# Patient Record
Sex: Female | Born: 1985 | Race: Black or African American | Hispanic: No | Marital: Single | Smoking: Never smoker
Health system: Southern US, Community
[De-identification: ages and names within clinical notes are randomized; demographics above are authoritative.]

## PROBLEM LIST (undated history)

## (undated) DIAGNOSIS — G4733 Obstructive sleep apnea (adult) (pediatric): Secondary | ICD-10-CM

## (undated) DIAGNOSIS — E119 Type 2 diabetes mellitus without complications: Secondary | ICD-10-CM

## (undated) DIAGNOSIS — E039 Hypothyroidism, unspecified: Secondary | ICD-10-CM

## (undated) DIAGNOSIS — I4891 Unspecified atrial fibrillation: Secondary | ICD-10-CM

## (undated) DIAGNOSIS — E876 Hypokalemia: Secondary | ICD-10-CM

## (undated) DIAGNOSIS — R000 Tachycardia, unspecified: Secondary | ICD-10-CM

## (undated) DIAGNOSIS — R0602 Shortness of breath: Secondary | ICD-10-CM

## (undated) DIAGNOSIS — R0609 Other forms of dyspnea: Secondary | ICD-10-CM

## (undated) DIAGNOSIS — D219 Benign neoplasm of connective and other soft tissue, unspecified: Secondary | ICD-10-CM

## (undated) DIAGNOSIS — U071 COVID-19: Secondary | ICD-10-CM

## (undated) DIAGNOSIS — I10 Essential (primary) hypertension: Secondary | ICD-10-CM

## (undated) HISTORY — DX: Unspecified atrial fibrillation: I48.91

## (undated) HISTORY — DX: Hypokalemia: E87.6

## (undated) HISTORY — DX: Type 2 diabetes mellitus without complications: E11.9

## (undated) HISTORY — DX: Tachycardia, unspecified: R00.0

## (undated) HISTORY — DX: Benign neoplasm of connective and other soft tissue, unspecified: D21.9

## (undated) HISTORY — DX: COVID-19: U07.1

## (undated) HISTORY — DX: Hypothyroidism, unspecified: E03.9

## (undated) HISTORY — DX: Shortness of breath: R06.02

## (undated) HISTORY — DX: Obstructive sleep apnea (adult) (pediatric): G47.33

## (undated) HISTORY — DX: Other forms of dyspnea: R06.09

---

## 2019-03-03 ENCOUNTER — Other Ambulatory Visit: Payer: Self-pay

## 2019-03-03 ENCOUNTER — Emergency Department (HOSPITAL_COMMUNITY)
Admission: EM | Admit: 2019-03-03 | Discharge: 2019-03-03 | Disposition: A | Discharge status: Home / Self Care | Payer: Medicaid - Out of State | Attending: Emergency Medicine | Admitting: Emergency Medicine

## 2019-03-03 ENCOUNTER — Encounter (HOSPITAL_COMMUNITY): Payer: Self-pay | Admitting: Emergency Medicine

## 2019-03-03 ENCOUNTER — Emergency Department (HOSPITAL_COMMUNITY): Payer: Medicaid - Out of State

## 2019-03-03 DIAGNOSIS — R002 Palpitations: Secondary | ICD-10-CM | POA: Diagnosis present

## 2019-03-03 DIAGNOSIS — I10 Essential (primary) hypertension: Secondary | ICD-10-CM | POA: Diagnosis not present

## 2019-03-03 DIAGNOSIS — I4891 Unspecified atrial fibrillation: Secondary | ICD-10-CM | POA: Diagnosis not present

## 2019-03-03 DIAGNOSIS — E876 Hypokalemia: Secondary | ICD-10-CM | POA: Diagnosis not present

## 2019-03-03 DIAGNOSIS — R0602 Shortness of breath: Secondary | ICD-10-CM | POA: Insufficient documentation

## 2019-03-03 HISTORY — DX: Essential (primary) hypertension: I10

## 2019-03-03 LAB — TROPONIN I: Troponin I: <0.03 ng/mL (ref <0.03)

## 2019-03-03 LAB — BASIC METABOLIC PANEL
Anion gap: 12 (ref 5–15)
BUN: <5 mg/dL — ABNORMAL LOW (ref 6–20)
CO2: 24 mmol/L (ref 22–32)
Calcium: 9.6 mg/dL (ref 8.9–10.3)
Chloride: 106 mmol/L (ref 98–111)
Creatinine, Ser: 0.63 mg/dL (ref 0.44–1.00)
GFR calc Af Amer: >60 mL/min (ref >60)
GFR calc non Af Amer: >60 mL/min (ref >60)
Glucose, Bld: 132 mg/dL — ABNORMAL HIGH (ref 70–99)
Potassium: 2.8 mmol/L — ABNORMAL LOW (ref 3.5–5.1)
Sodium: 142 mmol/L (ref 135–145)

## 2019-03-03 LAB — I-STAT BETA HCG BLOOD, ED (MC, WL, AP ONLY): I-stat hCG, quantitative: <5 m[IU]/mL (ref <5)

## 2019-03-03 LAB — CBC
HCT: 45.1 % (ref 36.0–46.0)
Hemoglobin: 15.9 g/dL — ABNORMAL HIGH (ref 12.0–15.0)
MCH: 32.2 pg (ref 26.0–34.0)
MCHC: 35.3 g/dL (ref 30.0–36.0)
MCV: 91.3 fL (ref 80.0–100.0)
Platelets: 259 10*3/uL (ref 150–400)
RBC: 4.94 MIL/uL (ref 3.87–5.11)
RDW: 13.2 % (ref 11.5–15.5)
WBC: 10.4 10*3/uL (ref 4.0–10.5)
nRBC: 0 % (ref 0.0–0.2)

## 2019-03-03 LAB — BRAIN NATRIURETIC PEPTIDE: B Natriuretic Peptide: 64.8 pg/mL (ref 0.0–100.0)

## 2019-03-03 LAB — D-DIMER, QUANTITATIVE: D-Dimer, Quant: 0.4 ug/mL-FEU (ref 0.00–0.50)

## 2019-03-03 MED ORDER — SODIUM CHLORIDE 0.9 % IV BOLUS
1000.0000 mL | Freq: Once | INTRAVENOUS | Status: AC
  Administered 2019-03-03: 12:00:00 1000 mL via INTRAVENOUS

## 2019-03-03 MED ORDER — POTASSIUM CHLORIDE CRYS ER 20 MEQ PO TBCR: 20.0000 meq | EXTENDED_RELEASE_TABLET | Freq: Two times a day (BID) | ORAL | 0 refills | Status: DC

## 2019-03-03 MED ORDER — DILTIAZEM HCL 25 MG/5ML IV SOLN
15.0000 mg | Freq: Once | INTRAVENOUS | Status: AC
  Administered 2019-03-03: 15 mg via INTRAVENOUS
  Filled 2019-03-03: qty 5

## 2019-03-03 MED ORDER — APIXABAN 5 MG PO TABS: 5.0000 mg | ORAL_TABLET | Freq: Two times a day (BID) | ORAL | 0 refills | Status: DC

## 2019-03-03 MED ORDER — APIXABAN 5 MG PO TABS
5.0000 mg | ORAL_TABLET | Freq: Two times a day (BID) | ORAL | Status: DC
  Administered 2019-03-03: 5 mg via ORAL
  Filled 2019-03-03 (×2): qty 1

## 2019-03-03 MED ORDER — POTASSIUM CHLORIDE CRYS ER 20 MEQ PO TBCR
40.0000 meq | EXTENDED_RELEASE_TABLET | Freq: Once | ORAL | Status: AC
  Administered 2019-03-03: 40 meq via ORAL
  Filled 2019-03-03: qty 2

## 2019-03-03 MED ORDER — ETOMIDATE 2 MG/ML IV SOLN
INTRAVENOUS | Status: AC | PRN
  Administered 2019-03-03: 10 mg via INTRAVENOUS

## 2019-03-03 MED ORDER — ETOMIDATE 2 MG/ML IV SOLN
10.0000 mg | Freq: Once | INTRAVENOUS | Status: DC
  Filled 2019-03-03: qty 10

## 2019-03-03 NOTE — ED Notes (Signed)
Patient verbalizes understanding of discharge instructions. Opportunity for questioning and answers were provided. Armband removed by staff, pt discharged from ED ambulatory to home.  

## 2019-03-03 NOTE — ED Notes (Signed)
Pt back in [facility]. EKG completed

## 2019-03-03 NOTE — ED Triage Notes (Signed)
Pt here for heart palpitations and high blood pressure.

## 2019-03-03 NOTE — ED Provider Notes (Signed)
[facility] Emergency Department Provider Note MRN:  [id number]  Arrival date & time: 03/03/19     Chief Complaint   Palpitations History of Present Illness   Carly Ramos is a 33 y.o. year-old female with a history of hypertension presenting to the ED with chief complaint of palpitations.  Patient began experiencing sudden onset palpitations and mild left-sided chest discomfort this morning at about 5 AM.  Felt like her heart was racing.  Patient's boyfriend thought maybe it was a panic attack but she did not feel anxious or stressed.  Mild shortness of breath.  Denies headache or vision change, no fever, no cough, no abdominal pain.  No drug or alcohol use.  Patient measured her blood pressure and found it to be elevated, took 1 of her amlodipine 10 mg tablets, which she has not taken since she was having issues with high blood pressure during pregnancy.  Symptoms constant, moderate, no exacerbating relieving factors.  Review of Systems  A complete 10 system review of systems was obtained and all systems are negative except as noted in the HPI and PMH.   Patient's Health History    Past Medical History:  Diagnosis Date  . Hypertension     History reviewed. No pertinent surgical history.  No family history on file.  Social History   Socioeconomic History  . Marital status: Single    Spouse name: Not on file  . Number of children: Not on file  . Years of education: Not on file  . Highest education level: Not on file  Occupational History  . Not on file  Social Needs  . Financial resource strain: Not on file  . Food insecurity:    Worry: Not on file    Inability: Not on file  . Transportation needs:    Medical: Not on file    Non-medical: Not on file  Tobacco Use  . Smoking status: Not on file  Substance and Sexual Activity  . Alcohol use: Not on file  . Drug use: Not on file  . Sexual activity: Not on file  Lifestyle  . Physical activity:    Days  per week: Not on file    Minutes per session: Not on file  . Stress: Not on file  Relationships  . Social connections:    Talks on phone: Not on file    Gets together: Not on file    Attends religious service: Not on file    Active member of club or organization: Not on file    Attends meetings of clubs or organizations: Not on file    Relationship status: Not on file  . Intimate partner violence:    Fear of current or ex partner: Not on file    Emotionally abused: Not on file    Physically abused: Not on file    Forced sexual activity: Not on file  Other Topics Concern  . Not on file  Social History Narrative  . Not on file     Physical Exam  Vital Signs and Nursing Notes reviewed Vitals:   03/03/19 1545 03/03/19 1600  BP: (!) 137/97 (!) 131/91  Pulse:    Resp: 19 16  Temp:    SpO2:      CONSTITUTIONAL: Well-appearing, NAD NEURO:  Alert and oriented x 3, no focal deficits EYES:  eyes equal and reactive ENT/NECK:  no LAD, no JVD CARDIO: Tachycardic and irregular rate, well-perfused, normal S1 and S2 PULM:  CTAB no wheezing  or rhonchi GI/GU:  normal bowel sounds, non-distended, non-tender MSK/SPINE:  No gross deformities, no edema SKIN:  no rash, atraumatic PSYCH:  Appropriate speech and behavior  Diagnostic and Interventional Summary    EKG Interpretation  Date/Time:  Sunday March 03 2019 11:15:39 EDT Ventricular Rate:  123 PR Interval:    QRS Duration: 96 QT Interval:  321 QTC Calculation: 458 R Axis:   61 Text Interpretation:  Atrial fibrillation Borderline repolarization abnormality Confirmed by Gerlene Fee [phone]) on 03/03/2019 11:33:46 AM      Labs Reviewed  CBC - Abnormal; Notable for the following components:      Result Value   Hemoglobin 15.9 (*)    All other components within normal limits  BASIC METABOLIC PANEL - Abnormal; Notable for the following components:   Potassium 2.8 (*)    Glucose, Bld 132 (*)    BUN <5 (*)    All other  components within normal limits  TROPONIN I  BRAIN NATRIURETIC PEPTIDE  D-DIMER, QUANTITATIVE (NOT AT [facility])  I-STAT BETA HCG BLOOD, ED ([facility], [facility], [facility] ONLY)    DG Chest Port 1 View  Final Result      Medications  etomidate (AMIDATE) injection 10 mg (10 mg Intravenous See Procedure Record 03/03/19 1441)  apixaban (ELIQUIS) tablet 5 mg (5 mg Oral Given 03/03/19 1509)  sodium chloride 0.9 % bolus 1,000 mL (0 mLs Intravenous Stopped 03/03/19 1441)  diltiazem (CARDIZEM) injection 15 mg (15 mg Intravenous Given 03/03/19 1158)  potassium chloride SA (K-DUR) CR tablet 40 mEq (40 mEq Oral Given 03/03/19 1509)  etomidate (AMIDATE) injection (10 mg Intravenous Given 03/03/19 1417)     .Cardioversion Date/Time: 03/03/2019 4:27 PM Performed by: Maudie Flakes, MD Authorized by: Maudie Flakes, MD   Consent:    Consent obtained:  Verbal and written   Consent given by:  Patient   Risks discussed:  Induced arrhythmia and pain   Alternatives discussed:  No treatment and rate-control medication Pre-procedure details:    Cardioversion basis:  Elective   Rhythm:  Atrial fibrillation   Electrode placement:  Anterior-posterior Patient sedated: Yes. Refer to sedation procedure documentation for details of sedation.  Attempt one:    Cardioversion mode:  Synchronous   Waveform:  Biphasic   Shock (Joules):  100   Shock outcome:  Conversion to normal sinus rhythm Post-procedure details:    Patient status:  Awake   Patient tolerance of procedure:  Tolerated well, no immediate complications .Sedation Date/Time: 03/03/2019 4:29 PM Performed by: Maudie Flakes, MD Authorized by: Maudie Flakes, MD   Consent:    Consent obtained:  Verbal and written   Consent given by:  Patient   Risks discussed:  Allergic reaction and respiratory compromise necessitating ventilatory assistance and intubation Universal protocol:    Immediately prior to procedure a time out was called: yes     Patient identity  confirmation method:  Provided demographic data, verbally with patient, arm band and hospital-assigned identification number Indications:    Procedure performed:  Cardioversion Pre-sedation assessment:    Time since last food or drink:  4 hours   ASA classification: class 1 - normal, healthy patient     Neck mobility: normal     Mallampati score:  I - soft palate, uvula, fauces, pillars visible   Pre-sedation assessments completed and reviewed: airway patency, cardiovascular function, mental status and respiratory function   Immediate pre-procedure details:    Reviewed: vital signs and relevant labs/tests  Verified: bag valve mask available, emergency equipment available, intubation equipment available, IV patency confirmed, oxygen available and suction available   Procedure details (see MAR for exact dosages):    Preoxygenation:  Nasal cannula   Sedation:  Etomidate   Intra-procedure events: none     Total Provider sedation time (minutes):  20 Post-procedure details:    Post-sedation assessments completed and reviewed: airway patency, cardiovascular function, mental status and respiratory function     Patient is stable for discharge or admission: yes     Patient tolerance:  Tolerated well, no immediate complications Comments:     Uncomplicated procedural sedation with 10 mg etomidate.   Critical Care Critical Care Documentation Critical care time provided by me (excluding procedures): 34 minutes  Condition necessitating critical care: Atrial fibrillation with rapid ventricular response  Components of critical care management: reviewing of prior records, laboratory and imaging interpretation, frequent re-examination and reassessment of vital signs, administration of IV diltiazem, discussion with consulting services    ED Course and Medical Decision Making  I have reviewed the triage vital signs and the nursing notes.  Pertinent labs & imaging results that were available  during my care of the patient were reviewed by me and considered in my medical decision making (see below for details).  Young healthy female presenting with A. fib and RVR, mild chest pain/shortness of breath, no history of A. fib, no personal history of DVT, no birth control, but positive family history of DVT and given patient's age this would be atypical for a young person to develop a primary cardiac A. fib.  Will evaluate for underlying cause, namely pulmonary embolism, labs, troponin, d-dimer pending.  Patient's onset time is estimated to be 5 AM, would be a potential candidate for cardioversion.  Will provide diltiazem for rate control while initiating work-up.  D-dimer negative, low potassium here, repleted.  Discussed case with cardiology given patient's age, cardiology agrees with appropriateness for cardioversion.  Performed as described above.  Patient is back to normal sinus rhythm, awake, feels much better, appropriate for follow-up in A. fib clinic.  Prescription for Eliquis, educated on the risks of anticoagulation, also prescription for potassium for continued repletion.  After the discussed management above, the patient was determined to be safe for discharge.  The patient was in agreement with this plan and all questions regarding their care were answered.  ED return precautions were discussed and the patient will return to the ED with any significant worsening of condition.  Barth Kirks. Sedonia Small, MD [facility] [email or web address]  Final Clinical Impressions(s) / ED Diagnoses     ICD-10-CM   1. Atrial fibrillation with RVR (HCC) I48.91   2. SOB (shortness of breath) R06.02 DG [facility] 1 View    DG Chest [facility] 1 View  3. Hypokalemia E87.6     ED Discharge Orders         Ordered    Amb referral to AFIB Clinic     03/03/19 1324    potassium chloride SA (K-DUR) 20 MEQ tablet  2 times daily     03/03/19 1625    apixaban (ELIQUIS) 5  MG TABS tablet  2 times daily     03/03/19 1625             Maudie Flakes, MD 03/03/19 [id number]

## 2019-03-03 NOTE — ED Notes (Signed)
Pt shocked at 100 jolts by MD [facility]

## 2019-03-03 NOTE — Discharge Instructions (Addendum)
You were evaluated in the Emergency Department and after careful evaluation, we did not find any emergent condition requiring admission or further testing in the hospital.  Your symptoms today seem to be due to atrial fibrillation.  We performed a cardioversion procedure today that returned your heart to the normal rhythm.  It is important that you call the A. fib clinic for follow-up within the next few days.  Please take the Eliquis blood thinner medication twice a day as directed.  Your labs showed low potassium, which might be contributing to your A. fib.  Please take the potassium supplement as directed and try to increase the amount of potassium in your diet.  Please return to the Emergency Department if you experience any worsening of your condition.  We encourage you to follow up with a primary care provider.  Thank you for allowing [place] to be a part of your care.   Information on my medicine - ELIQUIS (apixaban)  This medication education was reviewed with me or my healthcare representative as part of my discharge preparation.   WHY WAS ELIQUIS PRESCRIBED FOR YOU? Eliquis was prescribed for you to reduce the risk of a blood clot forming that can cause a stroke if you have a medical condition called atrial fibrillation (a type of irregular heartbeat).  WHAT DO YOU NEED TO KNOW ABOUT ELIQUIS ? Take your Eliquis TWICE DAILY - one tablet in the morning and one tablet in the evening with or without food. If you have difficulty swallowing the tablet whole please discuss with your pharmacist how to take the medication safely.  Take Eliquis exactly as prescribed by your doctor and DO NOT stop taking Eliquis without talking to the doctor who prescribed the medication.  Stopping may increase your risk of developing a stroke.  Refill your prescription before you run out.  After discharge, you should have regular check-up appointments with your healthcare provider that is prescribing your  Eliquis.  In the future your dose may need to be changed if your kidney function or weight changes by a significant amount or as you get older.  WHAT DO YOU DO IF YOU MISS A DOSE? If you miss a dose, take it as soon as you remember on the same day and resume taking twice daily.  Do not take more than one dose of ELIQUIS at the same time to make up a missed dose.  IMPORTANT SAFETY INFORMATION A possible side effect of Eliquis is bleeding. You should call your healthcare provider right away if you experience any of the following: Bleeding from an injury or your nose that does not stop. Unusual colored urine (red or dark brown) or unusual colored stools (red or black). Unusual bruising for unknown reasons. A serious fall or if you hit your head (even if there is no bleeding).  Some medicines may interact with Eliquis and might increase your risk of bleeding or clotting while on Eliquis. To help avoid this, consult your healthcare provider or pharmacist prior to using any new prescription or non-prescription medications, including herbals, vitamins, non-steroidal anti-inflammatory drugs (NSAIDs) and supplements.  This website has more information on Eliquis (apixaban): http://www.eliquis.com/eliquis/home

## 2019-03-03 NOTE — ED Notes (Signed)
Consent for the cardioversion have been completed and witnessed by this RN. Copy placed in medical record drawer

## 2019-03-05 ENCOUNTER — Telehealth (HOSPITAL_COMMUNITY): Payer: Self-pay | Admitting: *Deleted

## 2019-03-05 NOTE — Telephone Encounter (Signed)
lft vcml for pt to [facility] re referral to afib clinic since recent ED visit.  Pt dccv to NSR

## 2019-12-19 ENCOUNTER — Emergency Department (HOSPITAL_COMMUNITY): Payer: Medicaid - Out of State

## 2019-12-19 ENCOUNTER — Emergency Department (HOSPITAL_COMMUNITY)
Admission: EM | Admit: 2019-12-19 | Discharge: 2019-12-19 | Disposition: A | Discharge status: Home / Self Care | Payer: Medicaid - Out of State | Attending: Emergency Medicine | Admitting: Emergency Medicine

## 2019-12-19 ENCOUNTER — Other Ambulatory Visit: Payer: Self-pay

## 2019-12-19 DIAGNOSIS — R002 Palpitations: Secondary | ICD-10-CM | POA: Diagnosis not present

## 2019-12-19 DIAGNOSIS — R067 Sneezing: Secondary | ICD-10-CM | POA: Insufficient documentation

## 2019-12-19 DIAGNOSIS — I10 Essential (primary) hypertension: Secondary | ICD-10-CM | POA: Diagnosis not present

## 2019-12-19 DIAGNOSIS — R05 Cough: Secondary | ICD-10-CM | POA: Insufficient documentation

## 2019-12-19 DIAGNOSIS — M62838 Other muscle spasm: Secondary | ICD-10-CM | POA: Insufficient documentation

## 2019-12-19 DIAGNOSIS — R0789 Other chest pain: Secondary | ICD-10-CM | POA: Insufficient documentation

## 2019-12-19 DIAGNOSIS — Z7982 Long term (current) use of aspirin: Secondary | ICD-10-CM | POA: Diagnosis not present

## 2019-12-19 DIAGNOSIS — Z79899 Other long term (current) drug therapy: Secondary | ICD-10-CM | POA: Insufficient documentation

## 2019-12-19 LAB — CBC
HCT: 40.2 % (ref 36.0–46.0)
Hemoglobin: 13.6 g/dL (ref 12.0–15.0)
MCH: 32.2 pg (ref 26.0–34.0)
MCHC: 33.8 g/dL (ref 30.0–36.0)
MCV: 95 fL (ref 80.0–100.0)
Platelets: 231 10*3/uL (ref 150–400)
RBC: 4.23 MIL/uL (ref 3.87–5.11)
RDW: 13.1 % (ref 11.5–15.5)
WBC: 7 10*3/uL (ref 4.0–10.5)
nRBC: 0 % (ref 0.0–0.2)

## 2019-12-19 LAB — BASIC METABOLIC PANEL
Anion gap: 9 (ref 5–15)
BUN: <5 mg/dL — ABNORMAL LOW (ref 6–20)
CO2: 28 mmol/L (ref 22–32)
Calcium: 9.2 mg/dL (ref 8.9–10.3)
Chloride: 103 mmol/L (ref 98–111)
Creatinine, Ser: 0.67 mg/dL (ref 0.44–1.00)
GFR calc Af Amer: >60 mL/min (ref >60)
GFR calc non Af Amer: >60 mL/min (ref >60)
Glucose, Bld: 117 mg/dL — ABNORMAL HIGH (ref 70–99)
Potassium: 3.6 mmol/L (ref 3.5–5.1)
Sodium: 140 mmol/L (ref 135–145)

## 2019-12-19 LAB — TROPONIN I (HIGH SENSITIVITY): Troponin I (High Sensitivity): 3 ng/L (ref <18)

## 2019-12-19 LAB — I-STAT BETA HCG BLOOD, ED (MC, WL, AP ONLY): I-stat hCG, quantitative: <5 m[IU]/mL (ref <5)

## 2019-12-19 MED ORDER — NAPROXEN 500 MG PO TABS: 500.0000 mg | ORAL_TABLET | Freq: Two times a day (BID) | ORAL | 0 refills | Status: DC | PRN

## 2019-12-19 NOTE — Discharge Instructions (Addendum)
Please follow-up with your cardiologist as well as your primary care provider regarding today's encounter.  I have prescribed you naproxen you may take as needed for your reproducible chest wall discomfort.  Your EKG, chest x-ray, and lab work is all reassuring.  Suspect musculoskeletal injury versus residual discomfort from episode of atrial fibrillation that occurred yesterday that subsequently converted after the administration of your prescribed medication.  Please return to the ED or seek immediate medical attention for any new or worsening symptoms.

## 2019-12-19 NOTE — ED Notes (Signed)
Patient says she has a history AFIB, was asked to stop Eliquis, she now takes daily Aspirin 81 mg . Yesterday, she reports left chest tightness, sharp pain, felt like she had to "breath heavier" She thought it was gas but "the pain did not move" When she went to use the bathroom around midnight, she reports feeling left calf pain for about 5 mins

## 2019-12-19 NOTE — ED Triage Notes (Signed)
Pt reports feeling like she had heart palpitations while at work yesterday. Does have hx of afib. Missed daily medications yesterday. Reports feeling palpitations today but not as severe as yesterday. Skin, warm, dry. VSS. Reports dry cough, sharp pains yesterday and this morning.

## 2019-12-19 NOTE — ED Provider Notes (Signed)
[facility] Provider Note   CSN: [id number] Arrival date & time: 12/19/19  1051     History Chief Complaint  Patient presents with   Palpitations    Carly Ramos is a 34 y.o. female with PMH significant for HTN and paroxysmal A. fib with RVR diagnosed 03/03/2019 with subsequent ambulatory referral to atrial fibrillation clinic who presents to the ED with complaints of palpitations and chest tightness.  Patient reports that for the past 2 to 3 weeks she has been having a dry, nonproductive cough with intermittent sneezing.  She was evaluated at an urgent care and has tested negative for COVID-19 on multiple occasions.  Yesterday she was at the grocery store bending over to pick up a bag when she developed chest tightness.  Throughout the day, her tightness was approximately 7 out of 10 and she had associated heart palpitations.  She reports that she had not taken her regular medications in the morning and after taking them her palpitations ultimately resolved spontaneously.  Today she endorses continued chest tightness, worse with coughing.  She had attempted to schedule appoint with her cardiologist who subsequently referred her to the ED for evaluation given her symptoms.  Her last menses was at the end of January.  She also reports that in the middle of the night she went up to go the bathroom and developed what felt like a muscle spasm to her left calf which resolved spontaneous after 5 minutes.  She denies any fevers or chills, dizziness, headache, syncopal events, nausea or vomiting, abdominal pain, or other symptoms.  No history of clots.  She is also unsure as to why she has paroxysmal atrial fibrillation given that the only person in her family who has cardiac disease is her grandfather.  She does not drink alcohol or smoke tobacco.  Denies all other illicit drug use.   I reviewed patient's medical record and she was cardioverted previously with good  effect and ultimately started on Eliquis.  She is followed by [facility] and [facility] near [place], [place]. echocardiogram obtained 04/19/2019 demonstrated LVEF 65 to 70% however with impaired relaxation filling pattern.  HPI     Past Medical History:  Diagnosis Date   Hypertension     There are no problems to display for this patient.   No past surgical history on file.   OB History   No obstetric history on file.     No family history on file.  Social History   Tobacco Use   Smoking status: Not on file  Substance Use Topics   Alcohol use: Not on file   Drug use: Not on file    Home Medications Prior to Admission medications   Medication Sig Start Date End Date Taking? Authorizing Provider  amLODipine (NORVASC) 10 MG tablet Take 10 mg by mouth daily as needed (blood pressure).  11/05/18   [provider]  apixaban (ELIQUIS) 5 MG TABS tablet Take 1 tablet (5 mg total) by mouth 2 (two) times daily for 30 days. 03/03/19 04/02/19  Maudie Flakes, MD  aspirin-acetaminophen-caffeine (EXCEDRIN MIGRAINE) [phone] MG tablet Take 2 tablets by mouth daily.    [provider]  diphenhydrAMINE (BENADRYL) 25 MG tablet Take 25 mg by mouth every 6 (six) hours as needed for itching or allergies (hives).    [provider]  HYDROCORTISONE EX Apply 1 application topically as needed (hives).    [provider]  Multiple Vitamins-Minerals (MULTIVITAMIN  ADULT PO) Take 1 tablet by mouth daily. gummies    [provider]  naproxen (NAPROSYN) 500 MG tablet Take 1 tablet (500 mg total) by mouth 2 (two) times daily between meals as needed for moderate pain. 12/19/19   Corena Herter, PA-C  potassium chloride SA (K-DUR) 20 MEQ tablet Take 1 tablet (20 mEq total) by mouth 2 (two) times daily for 7 days. 03/03/19 03/10/19  Maudie Flakes, MD    Allergies    Penicillins  Review of Systems   Review of Systems  Constitutional:  Negative for chills and fever.  Respiratory: Positive for chest tightness. Negative for shortness of breath.   Cardiovascular: Positive for palpitations. Negative for leg swelling.  Gastrointestinal: Negative for abdominal pain and nausea.  Musculoskeletal: Negative for gait problem.  Skin: Negative for color change.  Neurological: Negative for dizziness.    Physical Exam Updated Vital Signs BP (!) 133/92    Pulse (!) 58    Temp 98.5 F (36.9 C) (Oral)    Resp 16    SpO2 100%   Physical Exam Vitals and nursing note reviewed. Exam conducted with a chaperone present.  Constitutional:      Appearance: Normal appearance.  HENT:     Head: Normocephalic and atraumatic.  Eyes:     General: No scleral icterus.    Conjunctiva/sclera: Conjunctivae normal.  Cardiovascular:     Rate and Rhythm: Normal rate and regular rhythm.     Pulses: Normal pulses.     Heart sounds: Normal heart sounds.  Pulmonary:     Effort: Pulmonary effort is normal. No respiratory distress.     Breath sounds: Normal breath sounds. No wheezing or rales.  Chest:     Chest wall: Tenderness present.  Abdominal:     General: Abdomen is flat. There is no distension.     Palpations: Abdomen is soft.     Tenderness: There is no abdominal tenderness. There is no guarding.  Musculoskeletal:     Right lower leg: No edema.     Left lower leg: No edema.  Skin:    General: Skin is dry.     Capillary Refill: Capillary refill takes less than 2 seconds.  Neurological:     Mental Status: She is alert and oriented to person, place, and time.     GCS: GCS eye subscore is 4. GCS verbal subscore is 5. GCS motor subscore is 6.  Psychiatric:        Mood and Affect: Mood normal.        Behavior: Behavior normal.        Thought Content: Thought content normal.     ED Results / Procedures / Treatments   Labs (all labs ordered are listed, but only abnormal results are displayed) Labs Reviewed  BASIC METABOLIC PANEL -  Abnormal; Notable for the following components:      Result Value   Glucose, Bld 117 (*)    BUN <5 (*)    All other components within normal limits  CBC  I-STAT BETA HCG BLOOD, ED ([facility], [facility], [facility] ONLY)  TROPONIN I (HIGH SENSITIVITY)    EKG EKG Interpretation  Date/Time:  Thursday December 19 2019 10:54:21 EST Ventricular Rate:  69 PR Interval:  186 QRS Duration: 86 QT Interval:  390 QTC Calculation: 417 R Axis:   7 Text Interpretation: Normal sinus rhythm Nonspecific ST abnormality similar when compared to priors Confirmed by Quintella Reichert [phone]) on 12/19/2019 11:07:42 AM   Radiology  DG Chest Portable 1 View  Result Date: 12/19/2019 CLINICAL DATA:  Exam reason: cough, chest tightness Pt states chest pain only when she coughs EXAM: PORTABLE CHEST 1 VIEW COMPARISON:  Chest radiograph 03/03/2019 FINDINGS: The heart size and mediastinal contours are within normal limits. Tiny linear opacity in the left mid lung likely scarring or atelectasis. No focal consolidation. The visualized skeletal structures are unremarkable. IMPRESSION: No acute cardiopulmonary finding. Electronically Signed   By: Audie Pinto M.D.   On: 12/19/2019 12:18    Procedures Procedures (including critical care time)  Medications Ordered in ED Medications - No data to display  ED Course  I have reviewed the triage vital signs and the nursing notes.  Pertinent labs & imaging results that were available during my care of the patient were reviewed by me and considered in my medical decision making (see chart for details).    MDM Rules/Calculators/A&P                      Patient's history and physical exam is consistent with a costochondritis or similar musculoskeletal injury secondary to her coughing and sneezing.  It is reproducible on physical exam with palpation and is also reproducible with cough.  Patient is PERC negative and is not complaining of pleuritic chest discomfort or shortness of breath  symptoms.  Her vital signs are reassuring.  DG chest demonstrates no acute cardiopulmonary disease.  Will obtain a troponin given her nonspecific ST changes, however poor story for ACS.  We will not need to trend troponin given chronicity of her chest tightness.  Dr. Ralene Bathe also personally evaluated patient and believes that her chest tightness could perhaps be residual discomfort due to going into atrial fibrillation yesterday before converting after beta-blocker administration.  Will prescribe naproxen for the patient to take as needed for her chest wall discomfort.  Patient states that she is no longer taking her Eliquis.  Instructed not to take if she restarts her Eliquis or if she develops epigastric pain or melena.  Instructed her to reach out to her cardiologist to schedule follow-up as soon as possible for ongoing evaluation and management of her paroxysmal atrial fibrillation in light of yesterday's event.  Patient feels very reassured by today's work-up.  Very strict return precautions discussed with the patient.  All of the evaluation and work-up results were discussed with the patient and any family at bedside. They were provided opportunity to ask any additional questions and have none at this time. They have expressed understanding of verbal discharge instructions as well as return precautions and are agreeable to the plan.    Final Clinical Impression(s) / ED Diagnoses Final diagnoses:  Palpitations  Chest wall discomfort    Rx / DC Orders ED Discharge Orders         Ordered    naproxen (NAPROSYN) 500 MG tablet  2 times daily between meals PRN     12/19/19 1440           Reita Chard 12/19/19 1441    Quintella Reichert, MD 12/21/19 [phone]

## 2019-12-19 NOTE — ED Notes (Signed)
Patient verbalizes understanding of discharge instructions. Opportunity for questioning and answers were provided. Armband removed by staff, pt discharged from ED.  

## 2020-06-08 ENCOUNTER — Other Ambulatory Visit: Payer: Self-pay

## 2020-06-08 ENCOUNTER — Emergency Department (HOSPITAL_COMMUNITY): Payer: Medicaid - Out of State

## 2020-06-08 ENCOUNTER — Encounter (HOSPITAL_COMMUNITY): Payer: Self-pay | Admitting: Emergency Medicine

## 2020-06-08 ENCOUNTER — Observation Stay (HOSPITAL_COMMUNITY)
Admission: EM | Admit: 2020-06-08 | Discharge: 2020-06-11 | Disposition: A | Discharge status: Home / Self Care | Payer: Medicaid - Out of State | Attending: Family Medicine | Admitting: Family Medicine

## 2020-06-08 DIAGNOSIS — R7989 Other specified abnormal findings of blood chemistry: Secondary | ICD-10-CM

## 2020-06-08 DIAGNOSIS — I10 Essential (primary) hypertension: Secondary | ICD-10-CM | POA: Diagnosis not present

## 2020-06-08 DIAGNOSIS — R1084 Generalized abdominal pain: Secondary | ICD-10-CM | POA: Diagnosis not present

## 2020-06-08 DIAGNOSIS — K8000 Calculus of gallbladder with acute cholecystitis without obstruction: Secondary | ICD-10-CM | POA: Diagnosis not present

## 2020-06-08 DIAGNOSIS — I480 Paroxysmal atrial fibrillation: Secondary | ICD-10-CM

## 2020-06-08 DIAGNOSIS — K8051 Calculus of bile duct without cholangitis or cholecystitis with obstruction: Secondary | ICD-10-CM | POA: Diagnosis not present

## 2020-06-08 DIAGNOSIS — R945 Abnormal results of liver function studies: Secondary | ICD-10-CM | POA: Diagnosis not present

## 2020-06-08 DIAGNOSIS — Z7982 Long term (current) use of aspirin: Secondary | ICD-10-CM | POA: Insufficient documentation

## 2020-06-08 DIAGNOSIS — E860 Dehydration: Secondary | ICD-10-CM | POA: Diagnosis not present

## 2020-06-08 DIAGNOSIS — K8020 Calculus of gallbladder without cholecystitis without obstruction: Secondary | ICD-10-CM | POA: Diagnosis present

## 2020-06-08 DIAGNOSIS — R1033 Periumbilical pain: Secondary | ICD-10-CM | POA: Diagnosis present

## 2020-06-08 DIAGNOSIS — Z79899 Other long term (current) drug therapy: Secondary | ICD-10-CM | POA: Insufficient documentation

## 2020-06-08 DIAGNOSIS — E876 Hypokalemia: Secondary | ICD-10-CM

## 2020-06-08 DIAGNOSIS — Z20822 Contact with and (suspected) exposure to covid-19: Secondary | ICD-10-CM | POA: Diagnosis not present

## 2020-06-08 DIAGNOSIS — Z419 Encounter for procedure for purposes other than remedying health state, unspecified: Secondary | ICD-10-CM

## 2020-06-08 LAB — AMMONIA: Ammonia: 35 umol/L (ref 9–35)

## 2020-06-08 LAB — URINALYSIS, ROUTINE W REFLEX MICROSCOPIC
Bacteria, UA: NONE SEEN
Bilirubin Urine: NEGATIVE
Glucose, UA: NEGATIVE mg/dL
Ketones, ur: NEGATIVE mg/dL
Leukocytes,Ua: NEGATIVE
Nitrite: NEGATIVE
Protein, ur: NEGATIVE mg/dL
Specific Gravity, Urine: 1.002 — ABNORMAL LOW (ref 1.005–1.030)
pH: 7 (ref 5.0–8.0)

## 2020-06-08 LAB — CBC
HCT: 38.8 % (ref 36.0–46.0)
Hemoglobin: 13.4 g/dL (ref 12.0–15.0)
MCH: 32.1 pg (ref 26.0–34.0)
MCHC: 34.5 g/dL (ref 30.0–36.0)
MCV: 93 fL (ref 80.0–100.0)
Platelets: 259 10*3/uL (ref 150–400)
RBC: 4.17 MIL/uL (ref 3.87–5.11)
RDW: 12.7 % (ref 11.5–15.5)
WBC: 8 10*3/uL (ref 4.0–10.5)
nRBC: 0 % (ref 0.0–0.2)

## 2020-06-08 LAB — COMPREHENSIVE METABOLIC PANEL
ALT: 385 U/L — ABNORMAL HIGH (ref 0–44)
AST: 671 U/L — ABNORMAL HIGH (ref 15–41)
Albumin: 3.7 g/dL (ref 3.5–5.0)
Alkaline Phosphatase: 119 U/L (ref 38–126)
Anion gap: 12 (ref 5–15)
BUN: 7 mg/dL (ref 6–20)
CO2: 24 mmol/L (ref 22–32)
Calcium: 9.1 mg/dL (ref 8.9–10.3)
Chloride: 102 mmol/L (ref 98–111)
Creatinine, Ser: 0.68 mg/dL (ref 0.44–1.00)
GFR calc Af Amer: >60 mL/min (ref >60)
GFR calc non Af Amer: >60 mL/min (ref >60)
Glucose, Bld: 133 mg/dL — ABNORMAL HIGH (ref 70–99)
Potassium: 2.8 mmol/L — ABNORMAL LOW (ref 3.5–5.1)
Sodium: 138 mmol/L (ref 135–145)
Total Bilirubin: 1.9 mg/dL — ABNORMAL HIGH (ref 0.3–1.2)
Total Protein: 7.1 g/dL (ref 6.5–8.1)

## 2020-06-08 LAB — SARS CORONAVIRUS 2 BY RT PCR (HOSPITAL ORDER, PERFORMED IN ~~LOC~~ HOSPITAL LAB): SARS Coronavirus 2: NEGATIVE

## 2020-06-08 LAB — I-STAT BETA HCG BLOOD, ED (MC, WL, AP ONLY): I-stat hCG, quantitative: <5 m[IU]/mL (ref <5)

## 2020-06-08 LAB — PROTIME-INR
INR: 1.1 (ref 0.8–1.2)
Prothrombin Time: 13.9 seconds (ref 11.4–15.2)

## 2020-06-08 LAB — LACTIC ACID, PLASMA: Lactic Acid, Venous: 1.8 mmol/L (ref 0.5–1.9)

## 2020-06-08 LAB — ACETAMINOPHEN LEVEL: Acetaminophen (Tylenol), Serum: <10 ug/mL — ABNORMAL LOW (ref 10–30)

## 2020-06-08 LAB — LIPASE, BLOOD: Lipase: 37 U/L (ref 11–51)

## 2020-06-08 MED ORDER — ACETAMINOPHEN 650 MG RE SUPP: 650.0000 mg | Freq: Four times a day (QID) | RECTAL | Status: DC | PRN

## 2020-06-08 MED ORDER — ONDANSETRON HCL 4 MG/2ML IJ SOLN
4.0000 mg | Freq: Once | INTRAMUSCULAR | Status: AC
  Administered 2020-06-08: 4 mg via INTRAVENOUS
  Filled 2020-06-08: qty 2

## 2020-06-08 MED ORDER — ATENOLOL 25 MG PO TABS
25.0000 mg | ORAL_TABLET | Freq: Every day | ORAL | Status: DC
  Administered 2020-06-09 – 2020-06-11 (×3): 25 mg via ORAL
  Filled 2020-06-08 (×3): qty 1

## 2020-06-08 MED ORDER — SODIUM CHLORIDE 0.9% FLUSH
3.0000 mL | Freq: Two times a day (BID) | INTRAVENOUS | Status: DC
  Administered 2020-06-08 – 2020-06-11 (×5): 3 mL via INTRAVENOUS

## 2020-06-08 MED ORDER — SODIUM CHLORIDE 0.9 % IV BOLUS
1000.0000 mL | Freq: Once | INTRAVENOUS | Status: AC
  Administered 2020-06-08: 1000 mL via INTRAVENOUS

## 2020-06-08 MED ORDER — SODIUM CHLORIDE 0.9% FLUSH
3.0000 mL | Freq: Once | INTRAVENOUS | Status: AC
  Administered 2020-06-08: 3 mL via INTRAVENOUS

## 2020-06-08 MED ORDER — IOHEXOL 300 MG/ML  SOLN
100.0000 mL | Freq: Once | INTRAMUSCULAR | Status: AC | PRN
  Administered 2020-06-08: 100 mL via INTRAVENOUS

## 2020-06-08 MED ORDER — SODIUM CHLORIDE 0.9 % IV SOLN: INTRAVENOUS | Status: DC

## 2020-06-08 MED ORDER — ACETAMINOPHEN 325 MG PO TABS: 650.0000 mg | ORAL_TABLET | Freq: Four times a day (QID) | ORAL | Status: DC | PRN

## 2020-06-08 MED ORDER — HYDROCODONE-ACETAMINOPHEN 5-325 MG PO TABS: 1.0000 | ORAL_TABLET | ORAL | Status: DC | PRN

## 2020-06-08 MED ORDER — POTASSIUM CHLORIDE 10 MEQ/100ML IV SOLN
10.0000 meq | Freq: Once | INTRAVENOUS | Status: AC
  Administered 2020-06-08: 10 meq via INTRAVENOUS
  Filled 2020-06-08: qty 100

## 2020-06-08 MED ORDER — POTASSIUM CHLORIDE 10 MEQ/100ML IV SOLN
10.0000 meq | INTRAVENOUS | Status: AC
  Administered 2020-06-08 – 2020-06-09 (×4): 10 meq via INTRAVENOUS
  Filled 2020-06-08 (×4): qty 100

## 2020-06-08 MED ORDER — ONDANSETRON HCL 4 MG PO TABS: 4.0000 mg | ORAL_TABLET | Freq: Four times a day (QID) | ORAL | Status: DC | PRN

## 2020-06-08 MED ORDER — ONDANSETRON HCL 4 MG/2ML IJ SOLN
4.0000 mg | Freq: Four times a day (QID) | INTRAMUSCULAR | Status: DC | PRN
  Administered 2020-06-10 (×2): 4 mg via INTRAVENOUS
  Filled 2020-06-08 (×2): qty 2

## 2020-06-08 MED ORDER — DOCUSATE SODIUM 100 MG PO CAPS
100.0000 mg | ORAL_CAPSULE | Freq: Two times a day (BID) | ORAL | Status: DC
  Administered 2020-06-09 – 2020-06-11 (×3): 100 mg via ORAL
  Filled 2020-06-08 (×4): qty 1

## 2020-06-08 MED ORDER — MORPHINE SULFATE (PF) 4 MG/ML IV SOLN
4.0000 mg | Freq: Once | INTRAVENOUS | Status: AC
  Administered 2020-06-08: 4 mg via INTRAVENOUS
  Filled 2020-06-08: qty 1

## 2020-06-08 NOTE — ED Provider Notes (Signed)
[facility]PART[facility] Provider Note   CSN: [id number] Arrival date & time: 06/08/20  0350     History Chief Complaint  Patient presents with   Abdominal Pain   Emesis    Carly Ramos is a 34 y.o. female possible history of A. fib who presents for evaluation of of abdominal pain that began yesterday.  She reports that she ate breakfast and states that a few hours later, she started having pain.  She states it was in her lower abdomen periumbilical region.  She describes it as a cramping.  She initially thought it was either cramps from a menstrual cycle.  She states that pain persisted.  She tried Tylenol and heat at home with no improvement.  She reports only taking 2 Tylenol.  She states that then she started having nausea/vomiting.  She reports that there was some vomiting with small blood streaks noted but no gross hematemesis.  She had a normal bowel movement yesterday.  She states that the pain eased off last night.  She tried eating some broth and a peanut butter jelly sandwich and then 4 hours later, had return of her pain, nausea/vomiting.  She states she had been fine feeling in her normal state of health prior to onset of symptoms.  She has not recently had any fevers, chest pain, difficulty breathing, dysuria, hematuria.  She states that she had had some vaginal spotting which usually occurs right before her menstrual cycle.  She states that she does not frequently take Tylenol.  She denies any alcohol use.  Denies any history of IV drug use or hepatitis exposure.   The history is provided by the patient.       Past Medical History:  Diagnosis Date   Hypertension     There are no problems to display for this patient.   History reviewed. No pertinent surgical history.   OB History   No obstetric history on file.     History reviewed. No pertinent family history.  Social History   Tobacco Use   Smoking status: Not on file    Substance Use Topics   Alcohol use: Not on file   Drug use: Not on file    Home Medications Prior to Admission medications   Medication Sig Start Date End Date Taking? Authorizing Provider  amLODipine (NORVASC) 10 MG tablet Take 10 mg by mouth daily as needed (blood pressure).  11/05/18  Yes [provider]  aspirin 81 MG EC tablet Take 1 tablet by mouth daily. 03/06/20  Yes [provider]  aspirin-acetaminophen-caffeine (EXCEDRIN MIGRAINE) [phone] MG tablet Take 2 tablets by mouth daily.   Yes [provider]  atenolol (TENORMIN) 25 MG tablet Take 25 mg by mouth daily. 03/29/20  Yes [provider]  fluticasone (FLONASE) 50 MCG/ACT nasal spray Place 1 spray into both nostrils daily. 03/20/20  Yes [provider]  HYDROCORTISONE EX Apply 1 application topically as needed (hives).   Yes [provider]  Multiple Vitamins-Minerals (MULTIVITAMIN ADULT PO) Take 1 tablet by mouth daily. gummies   Yes [provider]  apixaban (ELIQUIS) 5 MG TABS tablet Take 1 tablet (5 mg total) by mouth 2 (two) times daily for 30 days. 03/03/19 04/02/19  Maudie Flakes, MD  naproxen (NAPROSYN) 500 MG tablet Take 1 tablet (500 mg total) by mouth 2 (two) times daily between meals as needed for moderate pain. Patient not taking: Reported on 06/08/2020 12/19/19   Corena Herter, PA-C  potassium chloride SA (K-DUR) 20 MEQ tablet Take 1 tablet (20 mEq total) by mouth 2 (two) times daily for 7 days. 03/03/19 03/10/19  Maudie Flakes, MD    Allergies    Penicillins  Review of Systems   Review of Systems  Constitutional: Negative for fever.  Respiratory: Negative for cough and shortness of breath.   Cardiovascular: Negative for chest pain.  Gastrointestinal: Positive for abdominal pain, nausea and vomiting.  Genitourinary: Negative for dysuria and hematuria.  Neurological: Negative for headaches.  All other systems reviewed and are  negative.   Physical Exam Updated Vital Signs BP (!) 156/105 (BP Location: Left Arm)    Pulse 85    Temp 98.6 F (37 C) (Oral)    Resp (!) 21    Ht '5\' 3"'  (1.6 m)    Wt 81.6 kg    SpO2 99%    BMI 31.89 kg/m   Physical Exam Vitals and nursing note reviewed.  Constitutional:      Appearance: Normal appearance. She is well-developed.  HENT:     Head: Normocephalic and atraumatic.  Eyes:     General: Lids are normal.     Conjunctiva/sclera: Conjunctivae normal.     Pupils: Pupils are equal, round, and reactive to light.  Cardiovascular:     Rate and Rhythm: Normal rate and regular rhythm.     Pulses: Normal pulses.     Heart sounds: Normal heart sounds. No murmur heard.  No friction rub. No gallop.   Pulmonary:     Effort: Pulmonary effort is normal.     Breath sounds: Normal breath sounds.     Comments: Lungs clear to auscultation bilaterally.  Symmetric chest rise.  No wheezing, rales, rhonchi. Abdominal:     Palpations: Abdomen is soft. Abdomen is not rigid.     Tenderness: There is abdominal tenderness in the right upper quadrant, right lower quadrant, periumbilical area, suprapubic area and left lower quadrant. There is no guarding. Positive signs include Murphy's sign.     Comments: Abdomen soft, nondistended.  No rigidity, guarding.  No CVA tenderness noted bilaterally.  Diffuse tenderness palpation of periumbilical, lower abdomen.  She does have some mild focal tenderness in the right lower quadrant.  Negative Rovsing, rebounding.  She also has tenderness noted to the right upper quadrant with positive Murphy sign.  Musculoskeletal:        General: Normal range of motion.     Cervical back: Full passive range of motion without pain.  Skin:    General: Skin is warm and dry.     Capillary Refill: Capillary refill takes less than 2 seconds.  Neurological:     Mental Status: She is alert and oriented to person, place, and time.  Psychiatric:        Speech: Speech normal.      ED Results / Procedures / Treatments   Labs (all labs ordered are listed, but only abnormal results are displayed) Labs Reviewed  COMPREHENSIVE METABOLIC PANEL - Abnormal; Notable for the following components:      Result Value   Potassium 2.8 (*)    Glucose, Bld 133 (*)    AST 671 (*)    ALT 385 (*)    Total Bilirubin 1.9 (*)    All other components within normal limits  URINALYSIS, ROUTINE W REFLEX MICROSCOPIC - Abnormal; Notable for the following components:   APPearance HAZY (*)    Specific Gravity, Urine 1.002 (*)    Hgb urine dipstick  SMALL (*)    All other components within normal limits  SARS CORONAVIRUS 2 BY RT PCR (HOSPITAL ORDER, [facility])  LIPASE, BLOOD  CBC  PROTIME-INR  HEPATITIS PANEL, ACUTE  ACETAMINOPHEN LEVEL  CK  MAGNESIUM  AMMONIA  LIPID PANEL  ETHANOL  RAPID URINE DRUG SCREEN, HOSP PERFORMED  LACTIC ACID, PLASMA  I-STAT BETA HCG BLOOD, ED ([facility], [facility], [facility] ONLY)    EKG None  Radiology CT ABDOMEN PELVIS W CONTRAST  Result Date: 06/08/2020 CLINICAL DATA:  34 year old female with abdominal pain. EXAM: CT ABDOMEN AND PELVIS WITH CONTRAST TECHNIQUE: Multidetector CT imaging of the abdomen and pelvis was performed using the standard protocol following bolus administration of intravenous contrast. CONTRAST:  184m OMNIPAQUE IOHEXOL 300 MG/ML  SOLN COMPARISON:  Abdominal ultrasound dated 06/08/2020 FINDINGS: Lower chest: The visualized lung bases are clear. No intra-abdominal free air or free fluid. Hepatobiliary: There is a 15 mm hypodense lesion in the dome of the liver (10/3) which is suboptimally characterized. The liver is otherwise unremarkable. No intrahepatic biliary ductal dilatation. There is gallstone. There is mild diffuse thickening of the gallbladder wall. Repeat ultrasound may provide better evaluation of the gallbladder if there is continued clinical concern for acute cholecystitis. Pancreas: Unremarkable. No pancreatic  ductal dilatation or surrounding inflammatory changes. Spleen: Normal in size without focal abnormality. Adrenals/Urinary Tract: The adrenal glands are unremarkable. The kidneys, visualized ureters, and urinary bladder appear unremarkable. Stomach/Bowel: There is moderate stool throughout the colon. There is no bowel obstruction or active inflammation. The appendix is normal. Vascular/Lymphatic: The abdominal aorta and IVC unremarkable. No portal venous gas. There is no adenopathy. Reproductive: The uterus is enlarged and myomatous. There is a 3.5 cm intracavitary or submucosal fibroid. Ultrasound or MRI may provide better evaluation of the pelvis and uterine fibroids. The ovaries are grossly unremarkable. Other: None Musculoskeletal: No acute or significant osseous findings. IMPRESSION: 1. Cholelithiasis with mild diffuse thickening of the gallbladder wall. Repeat ultrasound may provide better evaluation if there is continued clinical concern for acute cholecystitis. 2. No bowel obstruction. Normal appendix. 3. Enlarged myomatous uterus. Electronically Signed   By: AAnner CreteM.D.   On: 06/08/2020 20:54   UKoreaAbdomen Limited RUQ  Result Date: 06/08/2020 CLINICAL DATA:  34year old female with right upper quadrant pain, nausea and vomiting for 1 day. EXAM: ULTRASOUND ABDOMEN LIMITED RIGHT UPPER QUADRANT COMPARISON:  None. FINDINGS: Gallbladder: Shadowing echogenic gallstones, individually up to 15 mm diameter (image 8). Gallbladder wall thickness remains normal. No pericholecystic fluid. No sonographic Murphy sign elicited. Common bile duct: Diameter: 7 mm, mildly increased (image 25). No ductal filling defect identified. Liver: No focal lesion identified. Within normal limits in parenchymal echogenicity. Portal vein is patent on color Doppler imaging with normal direction of blood flow towards the liver. No intrahepatic biliary ductal dilatation. Other: No free fluid. IMPRESSION: Positive for  Cholelithiasis. No secondary findings of acute cholecystitis, although the CBD is mildly enlarged (7 mm) such that choledocholithiasis is difficult to exclude. Electronically Signed   By: HGenevie AnnM.D.   On: 06/08/2020 12:48    Procedures Procedures (including critical care time)  Medications Ordered in ED Medications  sodium chloride flush (NS) 0.9 % injection 3 mL (3 mLs Intravenous Not Given 06/08/20 1755)  potassium chloride 10 mEq in 100 mL IVPB ( Intravenous Rate/Dose Verify 06/08/20 2142)  sodium chloride 0.9 % bolus 1,000 mL ( Intravenous Rate/Dose Verify 06/08/20 2142)  ondansetron (ZOFRAN) injection 4 mg (4 mg Intravenous  Given 06/08/20 2009)  morphine 4 MG/ML injection 4 mg (4 mg Intravenous Given 06/08/20 2010)  iohexol (OMNIPAQUE) 300 MG/ML solution 100 mL (100 mLs Intravenous Contrast Given 06/08/20 2041)    ED Course  I have reviewed the triage vital signs and the nursing notes.  Pertinent labs & imaging results that were available during my care of the patient were reviewed by me and considered in my medical decision making (see chart for details).    MDM Rules/Calculators/A&P                          34 year old female who presents for evaluation of abdominal pain, nausea/vomiting.  No fevers.  On initially arrival, she is afebrile, nontoxic-appearing.  Vital signs are stable.  She has not had anything to eat since yesterday.  She reports pain has improved since being here in the ED but currently still rates it as a 4/10.  No fevers.  On initially arrival, she is afebrile, nontoxic-appearing.  Vital signs are stable.  On exam, she has tenderness noted to the periumbilical, lower abdomen and also right upper quadrant.  Concern for hepatobiliary etiology versus infectious etiology versus GU etiology.  We will plan to check labs.  History/physical exam not concerning for ovarian torsion.  UA shows small hemoglobin.  No evidence of infectious etiology.  CMP shows potassium of 3.8.  Normal  BUN and creatinine.  AST is 671, ALT is 385.  Alk phos is 118.  Total bili is 1.9.  Review of records show that patient with no priors for comparison.  Lipase is normal.  CBC shows no leukocytosis or anemia.  Will obtain right upper quadrant ultrasound for evaluation of hepatobiliary etiology.  Ultrasound does show cholelithiasis.  There is no secondary findings of acute cholecystitis.  The CBD is mildly enlarged at 7 mm.  Discussed patient with Dr. Paulita Fujita (GI) who recommends medicine admit. Patient can be on clear liquid diets with plans for MRCP.   CT abdomen pelvis shows cholelithiasis.  Appendix is normal.  She has fibroids noted in the uterus.  There is a 15 mm hypodense lesion in the dome of the liver which is suboptimally characterized.  Otherwise unremarkable.  No intrahepatic biliary ductal dilatation.  Discussed patient with Dr. Roel Cluck (hospitalist) who accepts patient for admission.   Portions of this note were generated with [employer]. Dictation errors may occur despite best attempts at proofreading.   Final Clinical Impression(s) / ED Diagnoses Final diagnoses:  Elevated LFTs  Generalized abdominal pain    Rx / DC Orders ED Discharge Orders    None       Desma Mcgregor 06/08/20 2145    Malvin Johns, MD 06/08/20 2343

## 2020-06-08 NOTE — ED Notes (Signed)
Patient transported to CT 

## 2020-06-08 NOTE — H&P (Signed)
Carly Ramos WER:[id number] DOB: Aug 09, 1986 DOA: 06/08/2020     PCP: Patient, No Pcp Per   Outpatient Specialists:  NONE    Patient arrived to ER on 06/08/20 at 0951 Referred by Attending Malvin Johns, MD   Patient coming from: home Lives  With family    Chief Complaint:   Chief Complaint  Patient presents with  . Abdominal Pain  . Emesis    HPI: Carly Ramos is a 34 y.o. female with medical history significant of hypertension, hx of A.fib was on Eliquis   Presented with   Abdominal pain since yesterday, nausea that worsened with eating vomiting up to 5 times.  She was eating toast and eggs and started to have pain few hours later.Feels a cramping similar to menstrual cycle.  She tried to take 2 Tylenol did not seem to help.  Noted that there was some streaks of red in her vomit.  Last normal bowel movement was yesterday.  She tried to eat but it made her nausea vomiting worse.  No prior troubles with abdominal pain before this.  No recent fevers or chills no chest pain no shortness of breath Patient does not smoke  Infectious risk factors:  Reports N/V abdominal pain,    Has not been vaccinated against COVID    Initial COVID TEST  NEGATIVE   Lab Results  Component Value Date   [place] NEGATIVE 06/08/2020    Regarding pertinent Chronic problems:      HTN on Norvasc atenolol     obesity-   BMI Readings from Last 1 Encounters:  06/08/20 31.89 kg/m      OSA -on  CPAP, has not started yet  A. Fib -  - CHA2DS2 vas score  2   Not on anticoagulation         -  Rate control:  Currently controlled with atenolol     While in ER: Noted to have hypokalemia down to 2.8 elevated LFTs Ultrasound showed cholelithiasis but no cholecystitis lipase normal  ER Provider Called: GI   Dr. Paulita Fujita They Recommend admit to medicine  Will see in AM plan for MRCP  Hospitalist was called for admission for elevated LFt  The following Work up has been ordered so far:  Orders  Placed This Encounter  Procedures  . SARS Coronavirus 2 by RT PCR (hospital order, performed in [facility] hospital lab) Nasopharyngeal Nasopharyngeal Swab  . US Abdomen Limited RUQ  . CT ABDOMEN PELVIS W CONTRAST  . Lipase, blood  . Comprehensive metabolic panel  . CBC  . Urinalysis, Routine w reflex microscopic  . Protime-INR  . Hepatitis panel, acute  . Acetaminophen level  . Diet clear liquid Room service appropriate? Yes; Fluid consistency: Thin  . [place] Lock IV, Maintain IV access  . Consult to gastroenterology  ALL PATIENTS BEING ADMITTED/HAVING PROCEDURES NEED COVID-19 SCREENING  . Consult for [place] Admission  ALL PATIENTS BEING ADMITTED/HAVING PROCEDURES NEED COVID-19 SCREENING  . I-Stat beta hCG blood, ED  . ED EKG  . EKG 12-Lead   Following Medications were ordered in ER: Medications  atenolol (TENORMIN) tablet 25 mg (has no administration in time range)  sodium chloride flush (NS) 0.9 % injection 3 mL (3 mLs Intravenous Given 06/08/20 2255)  docusate sodium (COLACE) capsule 100 mg (100 mg Oral Not Given 06/08/20 2227)  ondansetron (ZOFRAN) tablet 4 mg (has no administration in time range)    Or  ondansetron (ZOFRAN) injection 4 mg (has no administration in  time range)  0.9 %  sodium chloride infusion ( Intravenous New Bag/Given 06/08/20 2255)  potassium chloride 10 mEq in 100 mL IVPB (10 mEq Intravenous New Bag/Given 06/08/20 2224)  sodium chloride flush (NS) 0.9 % injection 3 mL (3 mLs Intravenous Given 06/08/20 2227)  potassium chloride 10 mEq in 100 mL IVPB ( Intravenous Stopped 06/08/20 2142)  sodium chloride 0.9 % bolus 1,000 mL (0 mLs Intravenous Stopped 06/08/20 2254)  ondansetron (ZOFRAN) injection 4 mg (4 mg Intravenous Given 06/08/20 2009)  morphine 4 MG/ML injection 4 mg (4 mg Intravenous Given 06/08/20 2010)  iohexol (OMNIPAQUE) 300 MG/ML solution 100 mL (100 mLs Intravenous Contrast Given 06/08/20 2041)     Significant initial  Findings: Abnormal Labs  Reviewed  COMPREHENSIVE METABOLIC PANEL - Abnormal; Notable for the following components:      Result Value   Potassium 2.8 (*)    Glucose, Bld 133 (*)    AST 671 (*)    ALT 385 (*)    Total Bilirubin 1.9 (*)    All other components within normal limits  URINALYSIS, ROUTINE W REFLEX MICROSCOPIC - Abnormal; Notable for the following components:   APPearance HAZY (*)    Specific Gravity, Urine 1.002 (*)    Hgb urine dipstick SMALL (*)    All other components within normal limits  ACETAMINOPHEN LEVEL - Abnormal; Notable for the following components:   Acetaminophen (Tylenol), Serum <10 (*)    All other components within normal limits   Otherwise labs showing:    Recent Labs  Lab 06/08/20 1048  NA 138  K 2.8*  CO2 24  GLUCOSE 133*  BUN 7  CREATININE 0.68  CALCIUM 9.1    Cr  stable,   Lab Results  Component Value Date   CREATININE 0.68 06/08/2020   CREATININE 0.67 12/19/2019   CREATININE 0.63 03/03/2019    Recent Labs  Lab 06/08/20 1048  AST 671*  ALT 385*  ALKPHOS 119  BILITOT 1.9*  PROT 7.1  ALBUMIN 3.7   Lab Results  Component Value Date   CALCIUM 9.1 06/08/2020  WBC     Component Value Date/Time   WBC 8.0 06/08/2020 1048    Plt: Lab Results  Component Value Date   PLT 259 06/08/2020       HG/HCT  stable,      Component Value Date/Time   HGB 13.4 06/08/2020 1048   HCT 38.8 06/08/2020 1048    Recent Labs  Lab 06/08/20 1048  LIPASE 37   No results for input(s): AMMONIA in the last 168 hours.    Cardiac Panel (last 3 results) No results for input(s): CKTOTAL, CKMB, TROPONINI, RELINDX in the last 72 hours.     ECG: Ordered Personally reviewed by me showing: HR : 84 Rhythm:  NSR,    nonspecific changes Flat T waves QTC 419   UA no evidence of UTI    Urine analysis:    Component Value Date/Time   COLORURINE YELLOW 06/08/2020 1730   APPEARANCEUR HAZY (A) 06/08/2020 1730   LABSPEC 1.002 (L) 06/08/2020 1730   PHURINE 7.0  06/08/2020 1730   GLUCOSEU NEGATIVE 06/08/2020 1730   HGBUR SMALL (A) 06/08/2020 1730   BILIRUBINUR NEGATIVE 06/08/2020 1730   KETONESUR NEGATIVE 06/08/2020 1730   PROTEINUR NEGATIVE 06/08/2020 1730   NITRITE NEGATIVE 06/08/2020 1730   LEUKOCYTESUR NEGATIVE 06/08/2020 1730      Ordered    CTabd/pelvis - cholelithiasis      [place] cholelethiasis    ED  Triage Vitals  Enc Vitals Group     BP 06/08/20 1023 (!) 173/102     Pulse Rate 06/08/20 1023 102     Resp 06/08/20 1023 16     Temp 06/08/20 1023 98.1 F (36.7 C)     Temp Source 06/08/20 1023 Oral     SpO2 06/08/20 1023 97 %     Weight 06/08/20 1044 180 lb (81.6 kg)     Height 06/08/20 1044 5\' 3"  (1.6 m)     Head Circumference --      Peak Flow --      Pain Score 06/08/20 1044 8     Pain Loc --      Pain Edu? --      Excl. in [place]? --   TMAX(24)@       Latest  Blood pressure (!) 156/105, pulse 85, temperature 98.6 F (37 C), temperature source Oral, resp. rate (!) 21, height 5\' 3"  (1.6 m), weight 81.6 kg, SpO2 99 %.    Review of Systems:    Pertinent positives include:   abdominal pain, nausea, vomiting,   Constitutional:  No weight loss, night sweats, Fevers, chills, fatigue, weight loss  HEENT:  No headaches, Difficulty swallowing,Tooth/dental problems,Sore throat,  No sneezing, itching, ear ache, nasal congestion, post nasal drip,  Cardio-vascular:  No chest pain, Orthopnea, PND, anasarca, dizziness, palpitations.no Bilateral lower extremity swelling  GI:  No heartburn, indigestion, diarrhea, change in bowel habits, loss of appetite, melena, blood in stool, hematemesis Resp:  no shortness of breath at rest. No dyspnea on exertion, No excess mucus, no productive cough, No non-productive cough, No coughing up of blood.No change in color of mucus.No wheezing. Skin:  no rash or lesions. No jaundice GU:  no dysuria, change in color of urine, no urgency or frequency. No straining to urinate.  No flank pain.    Musculoskeletal:  No joint pain or no joint swelling. No decreased range of motion. No back pain.  Psych:  No change in mood or affect. No depression or anxiety. No memory loss.  Neuro: no localizing neurological complaints, no tingling, no weakness, no double vision, no gait abnormality, no slurred speech, no confusion  All systems reviewed and apart from [place] all are negative  Past Medical History:   Past Medical History:  Diagnosis Date  . Hypertension       History reviewed. No pertinent surgical history.  Social History:  Ambulatory independently     has no history on file for tobacco use, alcohol use, and drug use.     Family History:   Family History  Problem Relation Age of Onset  . CAD Father     Allergies: Allergies  Allergen Reactions  . Penicillins Swelling    Did it involve swelling of the face/tongue/throat, SOB, or low BP? Yes Did it involve sudden or severe rash/hives, skin peeling, or any reaction on the inside of your mouth or nose? No Did you need to seek medical attention at a hospital or doctor's office? Yes When did it last happen?childhood If all above answers are "NO", may proceed with cephalosporin use.      Prior to Admission medications   Medication Sig Start Date End Date Taking? Authorizing Provider  amLODipine (NORVASC) 10 MG tablet Take 10 mg by mouth daily as needed (blood pressure).  11/05/18  Yes [provider]  aspirin 81 MG EC tablet Take 1 tablet by mouth daily. 03/06/20  Yes [provider]  aspirin-acetaminophen-caffeine Jearld Adjutant  MIGRAINE) 250-250-65 MG tablet Take 2 tablets by mouth daily.   Yes [provider]  atenolol (TENORMIN) 25 MG tablet Take 25 mg by mouth daily. 03/29/20  Yes [provider]  fluticasone (FLONASE) 50 MCG/ACT nasal spray Place 1 spray into both nostrils daily. 03/20/20  Yes [provider]  HYDROCORTISONE EX Apply 1 application topically as needed  (hives).   Yes [provider]  Multiple Vitamins-Minerals (MULTIVITAMIN ADULT PO) Take 1 tablet by mouth daily. gummies   Yes [provider]  apixaban (ELIQUIS) 5 MG TABS tablet Take 1 tablet (5 mg total) by mouth 2 (two) times daily for 30 days. 03/03/19 04/02/19  Maudie Flakes, MD  naproxen (NAPROSYN) 500 MG tablet Take 1 tablet (500 mg total) by mouth 2 (two) times daily between meals as needed for moderate pain. Patient not taking: Reported on 06/08/2020 12/19/19   Corena Herter, PA-C  potassium chloride SA (K-DUR) 20 MEQ tablet Take 1 tablet (20 mEq total) by mouth 2 (two) times daily for 7 days. 03/03/19 03/10/19  Maudie Flakes, MD   Physical Exam: Vitals with BMI 06/08/2020 06/08/2020 06/08/2020  Height - - -  Weight - - -  BMI - - -  Systolic 967 893 810  Diastolic 175 102 94  Pulse 85 69 102     1. General:  in No  Acute distress     Chronically ill -appearing 2. Psychological: Alert and Oriented 3. Head/ENT:     Dry Mucous Membranes                          Head Non traumatic, neck supple                            Poor Dentition 4. SKIN:  decreased Skin turgor,  Skin clean Dry and intact no rash 5. Heart: Regular rate and rhythm no  Murmur, no Rub or gallop 6. Lungs:   no wheezes or crackles   7. Abdomen: Soft, non-tender, Non distended  bowel sounds present 8. Lower extremities: no clubbing, cyanosis, no  edema 9. Neurologically Grossly intact, moving all 4 extremities equally   10. MSK: Normal range of motion   All other LABS:     Recent Labs  Lab 06/08/20 1048  WBC 8.0  HGB 13.4  HCT 38.8  MCV 93.0  PLT 259     Recent Labs  Lab 06/08/20 1048  NA 138  K 2.8*  CL 102  CO2 24  GLUCOSE 133*  BUN 7  CREATININE 0.68  CALCIUM 9.1     Recent Labs  Lab 06/08/20 1048  AST 671*  ALT 385*  ALKPHOS 119  BILITOT 1.9*  PROT 7.1  ALBUMIN 3.7       Cultures: No results found for: SDES, SPECREQUEST, CULT, REPTSTATUS   Radiological  Exams on Admission: CT ABDOMEN PELVIS W CONTRAST  Result Date: 06/08/2020 CLINICAL DATA:  34 year old female with abdominal pain. EXAM: CT ABDOMEN AND PELVIS WITH CONTRAST TECHNIQUE: Multidetector CT imaging of the abdomen and pelvis was performed using the standard protocol following bolus administration of intravenous contrast. CONTRAST:  11mL OMNIPAQUE IOHEXOL 300 MG/ML  SOLN COMPARISON:  Abdominal ultrasound dated 06/08/2020 FINDINGS: Lower chest: The visualized lung bases are clear. No intra-abdominal free air or free fluid. Hepatobiliary: There is a 15 mm hypodense lesion in the dome of the liver (10/3) which is suboptimally  characterized. The liver is otherwise unremarkable. No intrahepatic biliary ductal dilatation. There is gallstone. There is mild diffuse thickening of the gallbladder wall. Repeat ultrasound may provide better evaluation of the gallbladder if there is continued clinical concern for acute cholecystitis. Pancreas: Unremarkable. No pancreatic ductal dilatation or surrounding inflammatory changes. Spleen: Normal in size without focal abnormality. Adrenals/Urinary Tract: The adrenal glands are unremarkable. The kidneys, visualized ureters, and urinary bladder appear unremarkable. Stomach/Bowel: There is moderate stool throughout the colon. There is no bowel obstruction or active inflammation. The appendix is normal. Vascular/Lymphatic: The abdominal aorta and IVC unremarkable. No portal venous gas. There is no adenopathy. Reproductive: The uterus is enlarged and myomatous. There is a 3.5 cm intracavitary or submucosal fibroid. Ultrasound or MRI may provide better evaluation of the pelvis and uterine fibroids. The ovaries are grossly unremarkable. Other: None Musculoskeletal: No acute or significant osseous findings. IMPRESSION: 1. Cholelithiasis with mild diffuse thickening of the gallbladder wall. Repeat ultrasound may provide better evaluation if there is continued clinical concern for  acute cholecystitis. 2. No bowel obstruction. Normal appendix. 3. Enlarged myomatous uterus. Electronically Signed   By: Anner Crete M.D.   On: 06/08/2020 20:54   US Abdomen Limited RUQ  Result Date: 06/08/2020 CLINICAL DATA:  34 year old female with right upper quadrant pain, nausea and vomiting for 1 day. EXAM: ULTRASOUND ABDOMEN LIMITED RIGHT UPPER QUADRANT COMPARISON:  None. FINDINGS: Gallbladder: Shadowing echogenic gallstones, individually up to 15 mm diameter (image 8). Gallbladder wall thickness remains normal. No pericholecystic fluid. No sonographic Murphy sign elicited. Common bile duct: Diameter: 7 mm, mildly increased (image 25). No ductal filling defect identified. Liver: No focal lesion identified. Within normal limits in parenchymal echogenicity. Portal vein is patent on color Doppler imaging with normal direction of blood flow towards the liver. No intrahepatic biliary ductal dilatation. Other: No free fluid. IMPRESSION: Positive for Cholelithiasis. No secondary findings of acute cholecystitis, although the CBD is mildly enlarged (7 mm) such that choledocholithiasis is difficult to exclude. Electronically Signed   By: Genevie Ann M.D.   On: 06/08/2020 12:48    Chart has been reviewed   Assessment/Plan  34 y.o. female with medical history significant of hypertension, hx of A.fib was on Eliquis     Admitted for cholelethiasis, abd pain intractable nausea and vomiting Present on Admission: . Cholelithiasis -GI aware will see in the morning keep n.p.o. for possible MRCP   . Essential hypertension -restart atenolol when able to tolerate   . Elevated LFTs -most likely biliary in nature.  Hepatitis panel ordered.  GI aware.  Albumin and INR within normal limits.  Check ammonia level.  Acetaminophen level pending Patient denies taking large doses of acetaminophen COVID pending  . Dehydration -rehydrate therefore  . Hypokalemia -replace and check magnesium   . AF (paroxysmal atrial  fibrillation) (HCC) -continue atenolol to this point not on anticoagulation continue to hold Eliquis as patient may need procedure, Eliquis was stopped by her PCP she was only on aspirin  continue to follow   Other plan as per orders.  DVT prophylaxis:  SCD       Code Status:    Code Status: Not on file FULL CODE  as per patient   I had personally discussed CODE STATUS with patient     Family Communication:   Family not at  Bedside    Disposition Plan:     To home once workup is complete and patient is stable   Following barriers for discharge:  Electrolytes corrected                                                            Pain controlled with PO medications                                                       Will need to be able to tolerate PO                                                 Will need consultants to evaluate patient prior to discharge   [place] called:  [facility]   Admission status:  ED Disposition    ED Disposition Condition [place]: [place] [100100]  Level of Care: Telemetry Medical [104]  I expect the patient will be discharged within 24 hours: No (not a candidate for 5C-Observation unit)  Covid Evaluation: Asymptomatic Screening Protocol (No Symptoms)  Diagnosis: Elevated LFTs [503546]  Admitting Physician: Toy Baker [[id number]  Attending Physician: Toy Baker [[id number]]        Obs     Level of care   tele  For   24H      Lab Results  Component Value Date   [place] 06/08/2020     Precautions: admitted as  Covid Negative      PPE: Used by the provider:   N95   eye Goggles,  Gloves     Francenia Chimenti 06/08/2020, 11:40 PM    [facility]     after 2 AM please page floor coverage PA If 7AM-7PM, please contact the day team taking care of the patient using [email or web address]   Patient  was evaluated in the context of the global COVID-19 pandemic, which necessitated consideration that the patient might be at risk for infection with the SARS-CoV-2 virus that causes COVID-19. Institutional protocols and algorithms that pertain to the evaluation of patients at risk for COVID-19 are in a state of rapid change based on information released by regulatory bodies including the CDC and federal and state organizations. These policies and algorithms were followed during the patient's care.

## 2020-06-08 NOTE — ED Triage Notes (Signed)
Patient arrives to ED with complaints of RLQ and LLQ abdominal pain since yesterday afternoon. Patient states she is now getting nauseous after she eats and has thrown up five times. Denies fevers, chills, and diarrhea.

## 2020-06-09 ENCOUNTER — Inpatient Hospital Stay (HOSPITAL_COMMUNITY): Payer: Medicaid - Out of State

## 2020-06-09 ENCOUNTER — Encounter (HOSPITAL_COMMUNITY): Payer: Self-pay | Admitting: Internal Medicine

## 2020-06-09 DIAGNOSIS — K8051 Calculus of bile duct without cholangitis or cholecystitis with obstruction: Secondary | ICD-10-CM | POA: Diagnosis not present

## 2020-06-09 LAB — COMPREHENSIVE METABOLIC PANEL
ALT: 445 U/L — ABNORMAL HIGH (ref 0–44)
AST: 330 U/L — ABNORMAL HIGH (ref 15–41)
Albumin: 3.2 g/dL — ABNORMAL LOW (ref 3.5–5.0)
Alkaline Phosphatase: 126 U/L (ref 38–126)
Anion gap: 7 (ref 5–15)
BUN: <5 mg/dL — ABNORMAL LOW (ref 6–20)
CO2: 25 mmol/L (ref 22–32)
Calcium: 8.5 mg/dL — ABNORMAL LOW (ref 8.9–10.3)
Chloride: 105 mmol/L (ref 98–111)
Creatinine, Ser: 0.64 mg/dL (ref 0.44–1.00)
GFR calc Af Amer: >60 mL/min (ref >60)
GFR calc non Af Amer: >60 mL/min (ref >60)
Glucose, Bld: 112 mg/dL — ABNORMAL HIGH (ref 70–99)
Potassium: 2.9 mmol/L — ABNORMAL LOW (ref 3.5–5.1)
Sodium: 137 mmol/L (ref 135–145)
Total Bilirubin: 3.4 mg/dL — ABNORMAL HIGH (ref 0.3–1.2)
Total Protein: 6.5 g/dL (ref 6.5–8.1)

## 2020-06-09 LAB — LIPID PANEL
Cholesterol: 109 mg/dL (ref 0–200)
HDL: 42 mg/dL (ref >40)
LDL Cholesterol: 58 mg/dL (ref 0–99)
Total CHOL/HDL Ratio: 2.6 RATIO
Triglycerides: 46 mg/dL (ref <150)
VLDL: 9 mg/dL (ref 0–40)

## 2020-06-09 LAB — CBC WITH DIFFERENTIAL/PLATELET
Abs Immature Granulocytes: 0.02 10*3/uL (ref 0.00–0.07)
Basophils Absolute: 0 10*3/uL (ref 0.0–0.1)
Basophils Relative: 0 %
Eosinophils Absolute: 0.1 10*3/uL (ref 0.0–0.5)
Eosinophils Relative: 1 %
HCT: 35 % — ABNORMAL LOW (ref 36.0–46.0)
Hemoglobin: 11.9 g/dL — ABNORMAL LOW (ref 12.0–15.0)
Immature Granulocytes: 0 %
Lymphocytes Relative: 33 %
Lymphs Abs: 2.2 10*3/uL (ref 0.7–4.0)
MCH: 31.6 pg (ref 26.0–34.0)
MCHC: 34 g/dL (ref 30.0–36.0)
MCV: 93.1 fL (ref 80.0–100.0)
Monocytes Absolute: 0.4 10*3/uL (ref 0.1–1.0)
Monocytes Relative: 7 %
Neutro Abs: 4 10*3/uL (ref 1.7–7.7)
Neutrophils Relative %: 59 %
Platelets: 226 10*3/uL (ref 150–400)
RBC: 3.76 MIL/uL — ABNORMAL LOW (ref 3.87–5.11)
RDW: 13.2 % (ref 11.5–15.5)
WBC: 6.8 10*3/uL (ref 4.0–10.5)
nRBC: 0 % (ref 0.0–0.2)

## 2020-06-09 LAB — HEPATITIS PANEL, ACUTE
HCV Ab: NONREACTIVE
Hep A IgM: NONREACTIVE
Hep B C IgM: NONREACTIVE
Hepatitis B Surface Ag: NONREACTIVE

## 2020-06-09 LAB — TSH: TSH: 2.575 u[IU]/mL (ref 0.350–4.500)

## 2020-06-09 LAB — HIV ANTIBODY (ROUTINE TESTING W REFLEX): HIV Screen 4th Generation wRfx: NONREACTIVE

## 2020-06-09 LAB — MAGNESIUM: Magnesium: 1.9 mg/dL (ref 1.7–2.4)

## 2020-06-09 LAB — PHOSPHORUS: Phosphorus: 2.9 mg/dL (ref 2.5–4.6)

## 2020-06-09 MED ORDER — POTASSIUM CHLORIDE CRYS ER 20 MEQ PO TBCR
40.0000 meq | EXTENDED_RELEASE_TABLET | ORAL | Status: AC
  Administered 2020-06-09 (×2): 40 meq via ORAL
  Filled 2020-06-09 (×2): qty 2

## 2020-06-09 MED ORDER — SODIUM CHLORIDE 0.9 % IV SOLN: INTRAVENOUS | Status: DC

## 2020-06-09 MED ORDER — GADOBUTROL 1 MMOL/ML IV SOLN
8.0000 mL | Freq: Once | INTRAVENOUS | Status: AC | PRN
  Administered 2020-06-09: 8 mL via INTRAVENOUS

## 2020-06-09 MED ORDER — MUPIROCIN 2 % EX OINT
1.0000 "application " | TOPICAL_OINTMENT | Freq: Two times a day (BID) | CUTANEOUS | Status: DC
  Administered 2020-06-10 – 2020-06-11 (×4): 1 via NASAL
  Filled 2020-06-09 (×2): qty 22

## 2020-06-09 NOTE — Progress Notes (Signed)
Initial Nutrition Assessment  DOCUMENTATION CODES:   Not applicable  INTERVENTION:   Once diet advanced:   Boost Breeze po TID, each supplement provides 250 kcal and 9 grams of protein  MVI daily    NUTRITION DIAGNOSIS:   Inadequate oral intake related to poor appetite as evidenced by per patient/family report.  GOAL:   Patient will meet greater than or equal to 90% of their needs  MONITOR:   PO intake, Supplement acceptance, Weight trends, Labs, I & O's, Diet advancement  REASON FOR ASSESSMENT:   Consult Malnutrition Eval  ASSESSMENT:   Patient with PMH significant for HTN and a.fib. Presents this admission with cholelithiasis.   Plan MRCP today.   Pt endorses a poor appetite over the last two days due to increasing pain. Prior to this she consumed three meals daily that consisted of a protein, vegetable, and grain. Does not use supplementation. Currently NPO. Okay to have clear supplements once diet advanced. Does not like milky supplements.   Pt reports a UBW of 180 lb and denies wt loss. Records indicate pt weighed 170 lb on 03/03/2019 and 180 lb this admission.    Drips: NS @ 50 ml/hr  Medications: colace Labs: K 2.9 (L) LFTs elevated   NUTRITION - FOCUSED PHYSICAL EXAM:    Most Recent Value  Orbital Region No depletion  Upper Arm Region No depletion  Thoracic and Lumbar Region Unable to assess  Buccal Region No depletion  Temple Region No depletion  Clavicle Bone Region No depletion  Clavicle and Acromion Bone Region No depletion  Scapular Bone Region Unable to assess  Dorsal Hand No depletion  Patellar Region Unable to assess  Anterior Thigh Region Unable to assess  Posterior Calf Region Unable to assess  Edema (RD Assessment) Unable to assess  Hair Reviewed  Eyes Reviewed  Mouth Reviewed  Skin Reviewed  Nails Reviewed     Diet Order:   Diet Order            Diet NPO time specified  Diet effective midnight                 EDUCATION  NEEDS:   Not appropriate for education at this time  Skin:  Skin Assessment: Reviewed RN Assessment  Last BM:  8/2  Height:   Ht Readings from Last 1 Encounters:  06/08/20 5\' 3"  (1.6 m)    Weight:   Wt Readings from Last 1 Encounters:  06/08/20 81.6 kg    BMI:  Body mass index is 31.89 kg/m.  Estimated Nutritional Needs:   Kcal:  1800-2000 kcal  Protein:  90-105 grams  Fluid:  >/= 1.8 L/day  Mariana Single RD, LDN Clinical Nutrition Pager listed in [place]

## 2020-06-09 NOTE — Consult Note (Signed)
[facility] Consultation Note  Referring Provider: [facility] Primary Care Physician:  Patient, No Pcp Per  Reason for Consultation:  Abdominal pain, elevated LFTs  HPI: Carly Ramos is a 34 y.o. female whom we've been asked to see for above reason.  Two day history of post-prandial intermittent generalized abdominal pain accompanied by jaundice, nausea, vomiting.  Similar episode about one year ago, resolved.  Imaging shows gallstones and prominent bile duct.  Her pain has currently essentially resolved.   Past Medical History:  Diagnosis Date  . Hypertension     History reviewed. No pertinent surgical history.  Prior to Admission medications   Medication Sig Start Date End Date Taking? Authorizing Provider  amLODipine (NORVASC) 10 MG tablet Take 10 mg by mouth daily as needed (blood pressure).  11/05/18  Yes [provider]  aspirin 81 MG EC tablet Take 1 tablet by mouth daily. 03/06/20  Yes [provider]  aspirin-acetaminophen-caffeine (EXCEDRIN MIGRAINE) [phone] MG tablet Take 2 tablets by mouth daily.   Yes [provider]  atenolol (TENORMIN) 25 MG tablet Take 25 mg by mouth daily. 03/29/20  Yes [provider]  fluticasone (FLONASE) 50 MCG/ACT nasal spray Place 1 spray into both nostrils daily. 03/20/20  Yes [provider]  HYDROCORTISONE EX Apply 1 application topically as needed (hives).   Yes [provider]  Multiple Vitamins-Minerals (MULTIVITAMIN ADULT PO) Take 1 tablet by mouth daily. gummies   Yes [provider]  apixaban (ELIQUIS) 5 MG TABS tablet Take 1 tablet (5 mg total) by mouth 2 (two) times daily for 30 days. 03/03/19 04/02/19  Maudie Flakes, MD  naproxen (NAPROSYN) 500 MG tablet Take 1 tablet (500 mg total) by mouth 2 (two) times daily between meals as needed for moderate pain. Patient not taking: Reported on 06/08/2020 12/19/19   Corena Herter, PA-C  potassium chloride SA  (K-DUR) 20 MEQ tablet Take 1 tablet (20 mEq total) by mouth 2 (two) times daily for 7 days. 03/03/19 03/10/19  Maudie Flakes, MD    Current Facility-Administered Medications  Medication Dose Route Frequency Provider Last Rate Last Admin  . atenolol (TENORMIN) tablet 25 mg  25 mg Oral Daily Doutova, Anastassia, MD   25 mg at 06/09/20 1027  . docusate sodium (COLACE) capsule 100 mg  100 mg Oral BID Doutova, Anastassia, MD      . ondansetron (ZOFRAN) tablet 4 mg  4 mg Oral Q6H PRN Doutova, Anastassia, MD       Or  . ondansetron (ZOFRAN) injection 4 mg  4 mg Intravenous Q6H PRN Doutova, Anastassia, MD      . sodium chloride flush (NS) 0.9 % injection 3 mL  3 mL Intravenous Q12H Doutova, Anastassia, MD   3 mL at 06/08/20 2255    Allergies as of 06/08/2020 - Review Complete 06/08/2020  Allergen Reaction Noted  . Penicillins Swelling 03/03/2019    Family History  Problem Relation Age of Onset  . CAD Father     Social History   Socioeconomic History  . Marital status: Single    Spouse name: Not on file  . Number of children: Not on file  . Years of education: Not on file  . Highest education level: Not on file  Occupational History  . Not on file  Tobacco Use  . Smoking status: Never Smoker  . Smokeless tobacco: Never Used  Substance and Sexual Activity  . Alcohol use: Not Currently  . Drug use: Not on file  .  Sexual activity: Not on file  Other Topics Concern  . Not on file  Social History Narrative  . Not on file   Social Determinants of Health   Financial Resource Strain:   . Difficulty of Paying Living Expenses:   Food Insecurity:   . Worried About Charity fundraiser in the Last Year:   . Arboriculturist in the Last Year:   Transportation Needs:   . Film/video editor (Medical):   Marland Kitchen Lack of Transportation (Non-Medical):   Physical Activity:   . Days of Exercise per Week:   . Minutes of Exercise per Session:   Stress:   . Feeling of Stress :   Social  Connections:   . Frequency of Communication with Friends and Family:   . Frequency of Social Gatherings with Friends and Family:   . Attends Religious Services:   . Active Member of Clubs or Organizations:   . Attends Archivist Meetings:   Marland Kitchen Marital Status:   Intimate Partner Violence:   . Fear of Current or Ex-Partner:   . Emotionally Abused:   Marland Kitchen Physically Abused:   . Sexually Abused:     Review of Systems:  As per HPI, all others negative  Physical Exam: Vital signs in last 24 hours: Temp:  [97.9 F (36.6 C)-99.5 F (37.5 C)] 99.5 F (37.5 C) (08/03 0800) Pulse Rate:  [60-102] 73 (08/03 0800) Resp:  [14-23] 14 (08/03 0800) BP: (130-156)/(82-105) 132/82 (08/03 0800) SpO2:  [93 %-100 %] 97 % (08/03 0800) Weight:  [81.6 kg] 81.6 kg (08/02 1044) Last BM Date: 06/08/20 General:   Alert, overweight, Well-developed, well-nourished, pleasant and cooperative in NAD Head:  Normocephalic and atraumatic. Eyes:  Sclera icterus.   Conjunctiva pink. Ears:  Normal auditory acuity. Nose:  No deformity, discharge,  or lesions. Mouth:  No deformity or lesions.  Oropharynx pink & moist. Neck:  Supple; no masses or thyromegaly. Abdomen:  Soft, protuberant, nontender and nondistended. No masses, hepatosplenomegaly or hernias noted. Normal bowel sounds, without guarding, and without rebound.     Msk:  Symmetrical without gross deformities. Normal posture. Pulses:  Normal pulses noted. Extremities:  Without clubbing or edema. Neurologic:  Alert and  oriented x4;  grossly normal neurologically. Skin:  Intact without significant lesions or rashes. Psych:  Alert and cooperative. Normal mood and affect.   Lab Results: Recent Labs    06/08/20 1048 06/09/20 0609  WBC 8.0 6.8  HGB 13.4 11.9*  HCT 38.8 35.0*  PLT 259 226   BMET Recent Labs    06/08/20 1048 06/09/20 0609  NA 138 137  K 2.8* 2.9*  CL 102 105  CO2 24 25  GLUCOSE 133* 112*  BUN 7 <5*  CREATININE 0.68 0.64   CALCIUM 9.1 8.5*   LFT Recent Labs    06/09/20 0609  PROT 6.5  ALBUMIN 3.2*  AST 330*  ALT 445*  ALKPHOS 126  BILITOT 3.4*   PT/INR Recent Labs    06/08/20 2000  LABPROT 13.9  INR 1.1    Studies/Results: CT ABDOMEN PELVIS W CONTRAST  Result Date: 06/08/2020 CLINICAL DATA:  34 year old female with abdominal pain. EXAM: CT ABDOMEN AND PELVIS WITH CONTRAST TECHNIQUE: Multidetector CT imaging of the abdomen and pelvis was performed using the standard protocol following bolus administration of intravenous contrast. CONTRAST:  158mL OMNIPAQUE IOHEXOL 300 MG/ML  SOLN COMPARISON:  Abdominal ultrasound dated 06/08/2020 FINDINGS: Lower chest: The visualized lung bases are clear. No intra-abdominal free air  or free fluid. Hepatobiliary: There is a 15 mm hypodense lesion in the dome of the liver (10/3) which is suboptimally characterized. The liver is otherwise unremarkable. No intrahepatic biliary ductal dilatation. There is gallstone. There is mild diffuse thickening of the gallbladder wall. Repeat ultrasound may provide better evaluation of the gallbladder if there is continued clinical concern for acute cholecystitis. Pancreas: Unremarkable. No pancreatic ductal dilatation or surrounding inflammatory changes. Spleen: Normal in size without focal abnormality. Adrenals/Urinary Tract: The adrenal glands are unremarkable. The kidneys, visualized ureters, and urinary bladder appear unremarkable. Stomach/Bowel: There is moderate stool throughout the colon. There is no bowel obstruction or active inflammation. The appendix is normal. Vascular/Lymphatic: The abdominal aorta and IVC unremarkable. No portal venous gas. There is no adenopathy. Reproductive: The uterus is enlarged and myomatous. There is a 3.5 cm intracavitary or submucosal fibroid. Ultrasound or MRI may provide better evaluation of the pelvis and uterine fibroids. The ovaries are grossly unremarkable. Other: None Musculoskeletal: No acute or  significant osseous findings. IMPRESSION: 1. Cholelithiasis with mild diffuse thickening of the gallbladder wall. Repeat ultrasound may provide better evaluation if there is continued clinical concern for acute cholecystitis. 2. No bowel obstruction. Normal appendix. 3. Enlarged myomatous uterus. Electronically Signed   By: Anner Crete M.D.   On: 06/08/2020 20:54   US Abdomen Limited RUQ  Result Date: 06/08/2020 CLINICAL DATA:  34 year old female with right upper quadrant pain, nausea and vomiting for 1 day. EXAM: ULTRASOUND ABDOMEN LIMITED RIGHT UPPER QUADRANT COMPARISON:  None. FINDINGS: Gallbladder: Shadowing echogenic gallstones, individually up to 15 mm diameter (image 8). Gallbladder wall thickness remains normal. No pericholecystic fluid. No sonographic Murphy sign elicited. Common bile duct: Diameter: 7 mm, mildly increased (image 25). No ductal filling defect identified. Liver: No focal lesion identified. Within normal limits in parenchymal echogenicity. Portal vein is patent on color Doppler imaging with normal direction of blood flow towards the liver. No intrahepatic biliary ductal dilatation. Other: No free fluid. IMPRESSION: Positive for Cholelithiasis. No secondary findings of acute cholecystitis, although the CBD is mildly enlarged (7 mm) such that choledocholithiasis is difficult to exclude. Electronically Signed   By: Genevie Ann M.D.   On: 06/08/2020 12:48    Impression:  1.  Abdominal pain, migratory and generalized. 2.  Gallstones. 3.  Elevated LFTs. 4.  Prominent CBD.  Plan:  1.  MRI/MRCP for further evaluation. 2.  Follow LFTs. 3.  Pending MRCP findings, consider surgical follow-up. 4.  [facility] will follow.   LOS: 0 days   Marcellina Jonsson M  06/09/2020, 10:29 AM  Cell [phone] If no answer or after 5 PM call [phone]

## 2020-06-09 NOTE — Consult Note (Signed)
Reason for Consult: Cholelithiasis with possible cholecystitis Referring Physician: Dr. Donnie Mesa Carly Ramos is an 34 y.o. female.  HPI:  Pt is a 34 yo F who came to the ED 8/2 with symptoms of n/v abdominal pain after eating.  She has had symptoms for around 2 days.  The symptoms did not go away with pepto bismol or heating pad, but over time almost resolved.  However, when she tried to eat again, she had recurrent severe abdominal pain.  She had an episode last year of similar symptoms that were not as bad.  She was found to have an elevated bilirubin.  She is also a bit hypokalemic.  AST and ALT were elevated, but alk phos was normal.  MRCP was done today and showed no evidence of biliary obstruction. There was concern for acute cholecystitis.    She is currently pain free, but is anxious about having the pain again.  She denies fever/chills/jaundice.    Past Medical History:  Diagnosis Date  . Hypertension     History reviewed. No pertinent surgical history.  Family History  Problem Relation Age of Onset  . CAD Father     Social History:  reports that she has never smoked. She has never used smokeless tobacco. She reports previous alcohol use. No history on file for drug use. She is a Freight forwarder at a [employer].  Allergies:  Allergies  Allergen Reactions  . Penicillins Swelling    Did it involve swelling of the face/tongue/throat, SOB, or low BP? Yes Did it involve sudden or severe rash/hives, skin peeling, or any reaction on the inside of your mouth or nose? No Did you need to seek medical attention at a hospital or doctor's office? Yes When did it last happen?childhood If all above answers are "NO", may proceed with cephalosporin use.     Medications:  Current Meds  Medication Sig  . amLODipine (NORVASC) 10 MG tablet Take 10 mg by mouth daily as needed (blood pressure).   Marland Kitchen aspirin 81 MG EC tablet Take 1 tablet by mouth daily.  Marland Kitchen aspirin-acetaminophen-caffeine  (EXCEDRIN MIGRAINE) 250-250-65 MG tablet Take 2 tablets by mouth daily.  Marland Kitchen atenolol (TENORMIN) 25 MG tablet Take 25 mg by mouth daily.  . fluticasone (FLONASE) 50 MCG/ACT nasal spray Place 1 spray into both nostrils daily.  Marland Kitchen HYDROCORTISONE EX Apply 1 application topically as needed (hives).  . Multiple Vitamins-Minerals (MULTIVITAMIN ADULT PO) Take 1 tablet by mouth daily. gummies     Results for orders placed or performed during the hospital encounter of 06/08/20 (from the past 48 hour(s))  Lipase, blood     Status: None   Collection Time: 06/08/20 10:48 AM  Result Value Ref Range   Lipase 37 11 - 51 U/L    Comment: Performed at [facility], [address], [place], [place] [address]  Comprehensive metabolic panel     Status: Abnormal   Collection Time: 06/08/20 10:48 AM  Result Value Ref Range   Sodium 138 135 - 145 mmol/L   Potassium 2.8 (L) 3.5 - 5.1 mmol/L   Chloride 102 98 - 111 mmol/L   CO2 24 22 - 32 mmol/L   Glucose, Bld 133 (H) 70 - 99 mg/dL    Comment: Glucose reference range applies only to samples taken after fasting for at least 8 hours.   BUN 7 6 - 20 mg/dL   Creatinine, Ser 0.68 0.44 - 1.00 mg/dL   Calcium 9.1 8.9 - 10.3 mg/dL  Total Protein 7.1 6.5 - 8.1 g/dL   Albumin 3.7 3.5 - 5.0 g/dL   AST 671 (H) 15 - 41 U/L   ALT 385 (H) 0 - 44 U/L   Alkaline Phosphatase 119 38 - 126 U/L   Total Bilirubin 1.9 (H) 0.3 - 1.2 mg/dL   GFR calc non Af Amer >60 >60 mL/min   GFR calc Af Amer >60 >60 mL/min   Anion gap 12 5 - 15    Comment: Performed at [facility] [address] [place] [address]  CBC     Status: None   Collection Time: 06/08/20 10:48 AM  Result Value Ref Range   WBC 8.0 4.0 - 10.5 K/uL   RBC 4.17 3.87 - 5.11 MIL/uL   Hemoglobin 13.4 12.0 - 15.0 g/dL   HCT 38.8 36 - 46 %   MCV 93.0 80.0 - 100.0 fL   MCH 32.1 26.0 - 34.0 pg   MCHC 34.5 30.0 - 36.0 g/dL   RDW 12.7 11.5 - 15.5 %   Platelets 259 150 - 400 K/uL   nRBC 0.0 0.0 - 0.2  %    Comment: Performed at [facility], [place] [address], [place], [place] [address]  I-Stat beta hCG blood, ED     Status: None   Collection Time: 06/08/20 11:06 AM  Result Value Ref Range   I-stat hCG, quantitative <5.0 <5 mIU/mL   Comment 3            Comment:   GEST. AGE      CONC.  (mIU/mL)   <=1 WEEK        5 - 50     2 WEEKS       50 - 500     3 WEEKS       100 - 10,000     4 WEEKS     1,000 - 30,000        FEMALE AND NON-PREGNANT FEMALE:     LESS THAN 5 mIU/mL   Urinalysis, Routine w reflex microscopic     Status: Abnormal   Collection Time: 06/08/20  5:30 PM  Result Value Ref Range   Color, Urine YELLOW YELLOW   APPearance HAZY (A) CLEAR   Specific Gravity, Urine 1.002 (L) 1.005 - 1.030   pH 7.0 5.0 - 8.0   Glucose, UA NEGATIVE NEGATIVE mg/dL   Hgb urine dipstick SMALL (A) NEGATIVE   Bilirubin Urine NEGATIVE NEGATIVE   Ketones, ur NEGATIVE NEGATIVE mg/dL   Protein, ur NEGATIVE NEGATIVE mg/dL   Nitrite NEGATIVE NEGATIVE   Leukocytes,Ua NEGATIVE NEGATIVE   RBC / HPF 0-5 0 - 5 RBC/hpf   WBC, UA 0-5 0 - 5 WBC/hpf   Bacteria, UA NONE SEEN NONE SEEN   Squamous Epithelial / LPF 0-5 0 - 5    Comment: Performed at [facility], [place] [address], [place], [place] [address]  Protime-INR     Status: None   Collection Time: 06/08/20  8:00 PM  Result Value Ref Range   Prothrombin Time 13.9 11.4 - 15.2 seconds   INR 1.1 0.8 - 1.2    Comment: (NOTE) INR goal varies based on device and disease states. Performed at [facility], [place] [address] [place], [place] [address]   Hepatitis panel, acute     Status: None   Collection Time: 06/08/20  8:00 PM  Result Value Ref Range   Hepatitis B Surface Ag NON REACTIVE  NON REACTIVE   HCV Ab NON REACTIVE NON REACTIVE    Comment: (NOTE) Nonreactive HCV antibody screen is consistent with no HCV infections,  unless recent infection is suspected or other evidence exists to indicate HCV infection.     Hep A IgM NON  REACTIVE NON REACTIVE   Hep B C IgM NON REACTIVE NON REACTIVE    Comment: Performed at [facility], [place] [address] [place], [place] [address]  Acetaminophen level     Status: Abnormal   Collection Time: 06/08/20  8:00 PM  Result Value Ref Range   Acetaminophen (Tylenol), Serum <10 (L) 10 - 30 ug/mL    Comment: (NOTE) Therapeutic concentrations vary significantly. A range of 10-30 ug/mL  may be an effective concentration for many patients. However, some  are best treated at concentrations outside of this range. Acetaminophen concentrations >150 ug/mL at 4 hours after ingestion  and >50 ug/mL at 12 hours after ingestion are often associated with  toxic reactions.  Performed at [facility], [place] [address] [place] [address], [place], [place] [address]   SARS Coronavirus 2 by RT PCR (hospital order, performed in [facility] hospital lab) Nasopharyngeal Nasopharyngeal Swab     Status: None   Collection Time: 06/08/20  9:36 PM   Specimen: Nasopharyngeal Swab  Result Value Ref Range   SARS Coronavirus 2 NEGATIVE NEGATIVE    Comment: (NOTE) SARS-CoV-2 target nucleic acids are NOT DETECTED.  The SARS-CoV-2 RNA is generally detectable in upper and lower respiratory specimens during the acute phase of infection. The lowest concentration of SARS-CoV-2 viral copies this assay can detect is 250 copies / mL. A negative result does not preclude SARS-CoV-2 infection and should not be used as the sole basis for treatment or other patient management decisions.  A negative result may occur with improper specimen collection / handling, submission of specimen other than nasopharyngeal swab, presence of viral mutation(s) within the areas targeted by this assay, and inadequate number of viral copies (<250 copies / mL). A negative result must be combined with clinical observations, patient history, and epidemiological information.  Fact Sheet for Patients:    StrictlyIdeas.no  Fact Sheet for Healthcare Providers: BankingDealers.co.za  This test is not yet approved or  cleared by the [place] FDA and has been authorized for detection and/or diagnosis of SARS-CoV-2 by FDA under an Emergency Use Authorization (EUA).  This EUA will remain in effect (meaning this test can be used) for the duration of the COVID-19 declaration under Section 564(b)(1) of the Act, 21 U.S.C. section 360bbb-3(b)(1), unless the authorization is terminated or revoked sooner.  Performed at [facility], [place] [address] [place], [place] [address]   Ammonia     Status: None   Collection Time: 06/08/20 10:22 PM  Result Value Ref Range   Ammonia 35 9 - 35 umol/L    Comment: Performed at [facility], [place] [address], [place], [place] [address]  Lactic acid, plasma     Status: None   Collection Time: 06/08/20 10:22 PM  Result Value Ref Range   Lactic Acid, Venous 1.8 0.5 - 1.9 mmol/L    Comment: Performed at [facility] [address], [place], [place] [address]  Lipid panel     Status: None   Collection Time: 06/09/20  6:09 AM  Result Value Ref Range   Cholesterol 109 0 - 200 mg/dL   Triglycerides 46 <150 mg/dL   HDL 42 >40 mg/dL  Total CHOL/HDL Ratio 2.6 RATIO   VLDL 9 0 - 40 mg/dL   LDL Cholesterol 58 0 - 99 mg/dL    Comment:        Total Cholesterol/HDL:CHD Risk Coronary Heart Disease Risk Table                     Men   Women  1/2 Average Risk   3.4   3.3  Average Risk       5.0   4.4  2 X Average Risk   9.6   7.1  3 X Average Risk  23.4   11.0        Use the calculated Patient Ratio above and the CHD Risk Table to determine the patient's CHD Risk.        ATP III CLASSIFICATION (LDL):  <100     mg/dL   Optimal  100-129  mg/dL   Near or Above                    Optimal  130-159  mg/dL   Borderline  160-189  mg/dL   High  >190     mg/dL   Very High Performed at [facility] [address], [place], [place] [address]   HIV Antibody (routine testing w rflx)     Status: None   Collection Time: 06/09/20  6:09 AM  Result Value Ref Range   HIV Screen 4th Generation wRfx Non Reactive Non Reactive    Comment: Performed at [facility], [place] [address] [place], [place] [address]  Magnesium     Status: None   Collection Time: 06/09/20  6:09 AM  Result Value Ref Range   Magnesium 1.9 1.7 - 2.4 mg/dL    Comment: Performed at [facility] [address] [place], [place] [address]  Phosphorus     Status: None   Collection Time: 06/09/20  6:09 AM  Result Value Ref Range   Phosphorus 2.9 2.5 - 4.6 mg/dL    Comment: Performed at [facility] [address], [place], [place] [address]  CBC WITH DIFFERENTIAL     Status: Abnormal   Collection Time: 06/09/20  6:09 AM  Result Value Ref Range   WBC 6.8 4.0 - 10.5 K/uL   RBC 3.76 (L) 3.87 - 5.11 MIL/uL   Hemoglobin 11.9 (L) 12.0 - 15.0 g/dL   HCT 35.0 (L) 36 - 46 %   MCV 93.1 80.0 - 100.0 fL   MCH 31.6 26.0 - 34.0 pg   MCHC 34.0 30.0 - 36.0 g/dL   RDW 13.2 11.5 - 15.5 %   Platelets 226 150 - 400 K/uL   nRBC 0.0 0.0 - 0.2 %   Neutrophils Relative % 59 %   Neutro Abs 4.0 1.7 - 7.7 K/uL   Lymphocytes Relative 33 %   Lymphs Abs 2.2 0.7 - 4.0 K/uL   Monocytes Relative 7 %   Monocytes Absolute 0.4 0 - 1 K/uL   Eosinophils Relative 1 %   Eosinophils Absolute 0.1 0 - 0 K/uL   Basophils Relative 0 %   Basophils Absolute 0.0 0 - 0 K/uL   Immature Granulocytes 0 %   Abs Immature Granulocytes 0.02 0.00 - 0.07 K/uL    Comment: Performed at [facility], [address], [place], [place] [address]  TSH     Status: None   Collection Time: 06/09/20  6:09 AM  Result Value Ref Range   TSH 2.575 0.350 - 4.500 uIU/mL    Comment: Performed by a 3rd Generation assay with a functional sensitivity of <=0.01 uIU/mL. Performed at [facility], [place] [address] [place], [place] [address]    Comprehensive metabolic panel     Status: Abnormal   Collection Time: 06/09/20  6:09 AM  Result Value Ref Range   Sodium 137 135 - 145 mmol/L   Potassium 2.9 (L) 3.5 - 5.1 mmol/L   Chloride 105 98 - 111 mmol/L   CO2 25 22 - 32 mmol/L   Glucose, Bld 112 (H) 70 - 99 mg/dL    Comment: Glucose reference range applies only to samples taken after fasting for at least 8 hours.   BUN <5 (L) 6 - 20 mg/dL   Creatinine, Ser 0.64 0.44 - 1.00 mg/dL   Calcium 8.5 (L) 8.9 - 10.3 mg/dL   Total Protein 6.5 6.5 - 8.1 g/dL   Albumin 3.2 (L) 3.5 - 5.0 g/dL   AST 330 (H) 15 - 41 U/L   ALT 445 (H) 0 - 44 U/L   Alkaline Phosphatase 126 38 - 126 U/L   Total Bilirubin 3.4 (H) 0.3 - 1.2 mg/dL   GFR calc non Af Amer >60 >60 mL/min   GFR calc Af Amer >60 >60 mL/min   Anion gap 7 5 - 15    Comment: Performed at [facility] [address], [place], [place] [address]    CT ABDOMEN PELVIS W CONTRAST  Result Date: 06/08/2020 CLINICAL DATA:  34 year old female with abdominal pain. EXAM: CT ABDOMEN AND PELVIS WITH CONTRAST TECHNIQUE: Multidetector CT imaging of the abdomen and pelvis was performed using the standard protocol following bolus administration of intravenous contrast. CONTRAST:  161m OMNIPAQUE IOHEXOL 300 MG/ML  SOLN COMPARISON:  Abdominal ultrasound dated 06/08/2020 FINDINGS: Lower chest: The visualized lung bases are clear. No intra-abdominal free air or free fluid. Hepatobiliary: There is a 15 mm hypodense lesion in the dome of the liver (10/3) which is suboptimally characterized. The liver is otherwise unremarkable. No intrahepatic biliary ductal dilatation. There is gallstone. There is mild diffuse thickening of the gallbladder wall. Repeat ultrasound may provide better evaluation of the gallbladder if there is continued clinical concern for acute cholecystitis. Pancreas: Unremarkable. No pancreatic ductal dilatation or surrounding inflammatory changes. Spleen: Normal in size without focal  abnormality. Adrenals/Urinary Tract: The adrenal glands are unremarkable. The kidneys, visualized ureters, and urinary bladder appear unremarkable. Stomach/Bowel: There is moderate stool throughout the colon. There is no bowel obstruction or active inflammation. The appendix is normal. Vascular/Lymphatic: The abdominal aorta and IVC unremarkable. No portal venous gas. There is no adenopathy. Reproductive: The uterus is enlarged and myomatous. There is a 3.5 cm intracavitary or submucosal fibroid. Ultrasound or MRI may provide better evaluation of the pelvis and uterine fibroids. The ovaries are grossly unremarkable. Other: None Musculoskeletal: No acute or significant osseous findings. IMPRESSION: 1. Cholelithiasis with mild diffuse thickening of the gallbladder wall. Repeat ultrasound may provide better evaluation if there is continued clinical concern for acute cholecystitis. 2. No bowel obstruction. Normal appendix. 3. Enlarged myomatous uterus. Electronically Signed   By: AAnner CreteM.D.   On: 06/08/2020 20:54   MR ABDOMEN MRCP W WO CONTAST  Result Date: 06/09/2020 CLINICAL DATA:  Abdominal pain.  Cholelithiasis. EXAM: MRI ABDOMEN WITHOUT AND WITH CONTRAST (INCLUDING MRCP) TECHNIQUE: Multiplanar multisequence MR imaging of the abdomen was performed both before and after the administration of  intravenous contrast. Heavily T2-weighted images of the biliary and pancreatic ducts were obtained, and three-dimensional MRCP images were rendered by post processing. CONTRAST:  15m GADAVIST GADOBUTROL 1 MMOL/ML IV SOLN COMPARISON:  CT scan 06/08/2020 and ultrasound examination 06/08/2020 FINDINGS: Lower chest: The lung bases are grossly clear. No pleural or pericardial effusion. Hepatobiliary: Simple nonenhancing 14 mm cyst is noted in the left hepatic dome region. No worrisome hepatic lesions or intrahepatic biliary dilatation. Numerous small gallstones are noted the gallbladder. There is also mild gallbladder  wall thickening and gallbladder wall edema. Moderate associated mucosal enhancement is noted. Findings suspicious for acute cholecystitis. Normal caliber and course of the common bile duct. No common bile duct stones. Pancreas:  No mass, inflammation or ductal dilatation. Spleen:  Normal size.  No focal lesions. Adrenals/Urinary Tract: The adrenal glands and kidneys are unremarkable. No worrisome renal lesions or hydronephrosis. Stomach/Bowel: The stomach, duodenum, visualized small bowel and visualize colon are unremarkable. Vascular/Lymphatic: The aorta and branch vessels are patent. The major venous structures are patent. No mesenteric or retroperitoneal mass or adenopathy. Other:  No ascites or abdominal wall hernia. Musculoskeletal: No significant bony findings. IMPRESSION: 1. Cholelithiasis and MRI findings suspicious for acute cholecystitis. Nuclear medicine hepatobiliary scan may be helpful to document cystic duct obstruction. 2. Normal caliber and course of the common bile duct. No common bile duct stones. 3. No acute abdominal findings otherwise and no mass lesions or adenopathy. Electronically Signed   By: PMarijo SanesM.D.   On: 06/09/2020 17:25   UKoreaAbdomen Limited RUQ  Result Date: 06/08/2020 CLINICAL DATA:  34year old female with right upper quadrant pain, nausea and vomiting for 1 day. EXAM: ULTRASOUND ABDOMEN LIMITED RIGHT UPPER QUADRANT COMPARISON:  None. FINDINGS: Gallbladder: Shadowing echogenic gallstones, individually up to 15 mm diameter (image 8). Gallbladder wall thickness remains normal. No pericholecystic fluid. No sonographic Murphy sign elicited. Common bile duct: Diameter: 7 mm, mildly increased (image 25). No ductal filling defect identified. Liver: No focal lesion identified. Within normal limits in parenchymal echogenicity. Portal vein is patent on color Doppler imaging with normal direction of blood flow towards the liver. No intrahepatic biliary ductal dilatation. Other: No  free fluid. IMPRESSION: Positive for Cholelithiasis. No secondary findings of acute cholecystitis, although the CBD is mildly enlarged (7 mm) such that choledocholithiasis is difficult to exclude. Electronically Signed   By: HGenevie AnnM.D.   On: 06/08/2020 12:48    Review of Systems  Constitutional: Negative.   HENT: Negative.   Eyes: Negative.   Respiratory: Negative.   Cardiovascular: Negative.   Gastrointestinal: Positive for abdominal pain, nausea and vomiting.  Endocrine: Negative.   Genitourinary: Negative.   Musculoskeletal: Negative.   Skin: Negative.   Allergic/Immunologic: Negative.   Neurological: Negative.   Hematological: Negative.   Psychiatric/Behavioral: Negative.   All other systems reviewed and are negative.    Blood pressure 132/82, pulse 79, temperature 98 F (36.7 C), resp. rate 14, height _0  (1.6 m), weight 81.6 kg, last menstrual period 05/16/2020, SpO2 95 %.   Physical Exam Constitutional:      General: She is not in acute distress.    Appearance: She is well-developed. She is not ill-appearing, toxic-appearing or diaphoretic.  HENT:     Head: Normocephalic and atraumatic.     Mouth/Throat:     Comments: Wearing mask Eyes:     General: No scleral icterus.    Extraocular Movements: Extraocular movements intact.     Pupils: Pupils are equal, round, and  reactive to light.  Cardiovascular:     Rate and Rhythm: Normal rate and regular rhythm.     Heart sounds: Normal heart sounds.  Pulmonary:     Effort: Pulmonary effort is normal. No respiratory distress.     Breath sounds: Normal breath sounds. No stridor. No wheezing, rhonchi or rales.  Chest:     Chest wall: No tenderness.  Abdominal:     General: Abdomen is flat. Bowel sounds are normal. There is no distension.     Palpations: Abdomen is soft. There is no fluid wave, hepatomegaly, splenomegaly or mass.     Tenderness: There is no abdominal tenderness.  Skin:    General: Skin is warm and dry.      Coloration: Skin is not cyanotic, jaundiced or pale.     Findings: No rash.  Neurological:     General: No focal deficit present.     Mental Status: She is alert and oriented to person, place, and time.     Cranial Nerves: No cranial nerve deficit.     Motor: No weakness.  Psychiatric:        Mood and Affect: Mood normal. Mood is not anxious or depressed.        Behavior: Behavior normal.       Assessment/Plan:  Cholelithiasis with possible cholecystitis. Hypokalemia Elevated transaminases Hyperbilirubinemia  Pt is no longer having pain and has normal white count.  Tests are equivocal for cholecystitis.   Will discuss with Dr. Redmond Pulling if he desires HIDA scan. Either way she has indication for cholecystectomy.   Reviewed post op restrictions and surgery.    Stark Klein 06/09/2020, 9:01 PM

## 2020-06-09 NOTE — Procedures (Signed)
Declined CPAP for tonight

## 2020-06-09 NOTE — Progress Notes (Signed)
PROGRESS NOTE  Carly Ramos  DOB: [birth year]  PCP: Patient, No Pcp Per VQX:[id number]  DOA: 06/08/2020  LOS: 0 days   Chief Complaint  Patient presents with  . Abdominal Pain  . Emesis   Brief narrative: Carly Ramos is a 34 y.o. female with PMH of hypertension, A. fib on aspirin Patient presented to the ED on 06/08/2020 with complaint of abdominal pain for 24 hours, nausea, and multiple episodes of vomiting.  Abdominal pain is cramping in nature similar to menstrual cycle.  She noted some streaks of red blood in her vomitus.  Last bowel movement was 24 hours prior to presentation.    In the ED, patient was afebrile, heart rate 102, blood pressure 173/102, breathing room air. Initial labs with potassium low at 2.8, creatinine 0.68, AST/ALT elevated to 671/385, total bilirubin slightly elevated at 1.9, alkaline phosphatase normal at 119. Lactic acid normal at 1.8 WBC count normal at 8 Acute hepatitis panel -normal Ultrasound right upper quadrant showed cholelithiasis without cholecystitis, CBD slightly dilated to 7 mm CT scan of abdomen pelvis showed cholelithiasis with mild diffuse thickening of the gallbladder wall.    Subjective: Patient was seen and examined this morning.  Pleasant young African-American female.  Lying down in bed.  Not in distress.  Feels better than at presentation.  Abdominal pain improving. Chart reviewed. Blood pressure mostly elevated overnight.  AST improving, ALT worsening  Assessment/Plan: Cholelithiasis -Presents with abdominal pain, nausea, vomiting.   -Ultrasound right upper quadrant CT abdomen showed cholelithiasis but did not show clear evidence of cholecystitis.  Mildly dilated CBD with normal alk phos. -GI consulted.  Pending MRCP.  May need surgical evaluation as well.  Elevated liver enzymes Recent Labs  Lab 06/08/20 1048 06/09/20 0609  AST 671* 330*  ALT 385* 445*  ALKPHOS 119 126  BILITOT 1.9* 3.4*  PROT 7.1 6.5  ALBUMIN 3.7 3.2*    -AST and ALT elevated with normal alk phos. -This morning, AST seems downtrending but ALT is trending up.  Bilirubin level trending up as well. -Continue to monitor.  Essential hypertension -Initial blood pressure elevated 170/102, partly secondary to pain. -Home meds include atenolol 25 mg daily and amlodipine 10 mg daily as needed  -Resumed atenolol this morning.  Amlodipine on hold.  Hydralazine IV as needed Proximal A. Fib -Atenolol resumed. -Aspirin on hold for now.   Hypokalemia  -Potassium level low at 2.8 on admission, replaced, remains low at 2.9 this morning.  Continue to replace.  Magnesium 1.9, phosphorus 2.9.     Liver lesion -per CT scan, there is a 15 mm hypodense lesion in the dome of the liver (10/3) which is suboptimally characterized.  Further imaging may clarify.  Mobility: Encourage ambulation Code Status:   Code Status: Full Code  Nutritional status: Body mass index is 31.89 kg/m.     Diet Order            Diet NPO time specified  Diet effective midnight                 DVT prophylaxis: SCDs Start: 06/08/20 2227   Antimicrobials:  None Fluid: None  Consultants: GI Family Communication:  At bedside  Status is: Observation  The patient will require care spanning > 2 midnights and should be moved to inpatient because: Ongoing active pain requiring inpatient pain management and IV treatments appropriate due to intensity of illness or inability to take PO  Dispo: The patient is from: Home  Anticipated d/c is to: Home              Anticipated d/c date is: 2 days              Patient currently is not medically stable to d/c.       Infusions:    Scheduled Meds: . atenolol  25 mg Oral Daily  . docusate sodium  100 mg Oral BID  . sodium chloride flush  3 mL Intravenous Q12H    Antimicrobials: Anti-infectives (From admission, onward)   None      PRN meds: ondansetron **OR** ondansetron (ZOFRAN) IV   Objective: Vitals:    06/09/20 0223 06/09/20 0800  BP: (!) 156/93 132/82  Pulse: 78 73  Resp: 20 14  Temp: 97.9 F (36.6 C) 99.5 F (37.5 C)  SpO2: 100% 97%    Intake/Output Summary (Last 24 hours) at 06/09/2020 1458 Last data filed at 06/09/2020 0700 Gross per 24 hour  Intake 2769.72 ml  Output --  Net 2769.72 ml   Filed Weights   06/08/20 1044  Weight: 81.6 kg   Weight change:  Body mass index is 31.89 kg/m.   Physical Exam: General exam: Appears calm and comfortable.  Skin: No rashes, lesions or ulcers. HEENT: Atraumatic, normocephalic, supple neck, no obvious bleeding Lungs: Clear to auscultate bilaterally CVS: Regular rate and rhythm, no murmur GI/Abd soft, minimal right upper quadrant tenderness, nondistended, bowel sound present CNS: Alert, awake, oriented x3 Psychiatry: Mood appropriate Extremities: No pedal edema, no calf tenderness  Data Review: I have personally reviewed the laboratory data and studies available.  Recent Labs  Lab 06/08/20 1048 06/09/20 0609  WBC 8.0 6.8  NEUTROABS  --  4.0  HGB 13.4 11.9*  HCT 38.8 35.0*  MCV 93.0 93.1  PLT 259 226   Recent Labs  Lab 06/08/20 1048 06/09/20 0609  NA 138 137  K 2.8* 2.9*  CL 102 105  CO2 24 25  GLUCOSE 133* 112*  BUN 7 <5*  CREATININE 0.68 0.64  CALCIUM 9.1 8.5*  MG  --  1.9  PHOS  --  2.9   No results found for: HGBA1C     Component Value Date/Time   CHOL 109 06/09/2020 0609   TRIG 46 06/09/2020 0609   HDL 42 06/09/2020 0609   CHOLHDL 2.6 06/09/2020 0609   VLDL 9 06/09/2020 0609   LDLCALC 58 06/09/2020 0609    Signed, Terrilee Croak, MD [facility] Pager: [phone] (Secure Chat preferred). 06/09/2020

## 2020-06-10 ENCOUNTER — Encounter (HOSPITAL_COMMUNITY): Payer: Self-pay | Admitting: Internal Medicine

## 2020-06-10 ENCOUNTER — Inpatient Hospital Stay (HOSPITAL_COMMUNITY): Payer: Medicaid - Out of State | Admitting: Certified Registered Nurse Anesthetist

## 2020-06-10 ENCOUNTER — Encounter (HOSPITAL_COMMUNITY)
Admission: EM | Disposition: A | Discharge status: Home / Self Care | Payer: Self-pay | Source: Home / Self Care | Attending: Emergency Medicine

## 2020-06-10 DIAGNOSIS — Z20822 Contact with and (suspected) exposure to covid-19: Secondary | ICD-10-CM | POA: Diagnosis not present

## 2020-06-10 DIAGNOSIS — K8051 Calculus of bile duct without cholangitis or cholecystitis with obstruction: Secondary | ICD-10-CM | POA: Diagnosis not present

## 2020-06-10 DIAGNOSIS — I10 Essential (primary) hypertension: Secondary | ICD-10-CM | POA: Diagnosis not present

## 2020-06-10 DIAGNOSIS — R945 Abnormal results of liver function studies: Secondary | ICD-10-CM | POA: Diagnosis not present

## 2020-06-10 DIAGNOSIS — K8000 Calculus of gallbladder with acute cholecystitis without obstruction: Secondary | ICD-10-CM | POA: Diagnosis not present

## 2020-06-10 HISTORY — PX: CHOLECYSTECTOMY: SHX55

## 2020-06-10 LAB — CBC WITH DIFFERENTIAL/PLATELET
Abs Immature Granulocytes: 0.01 10*3/uL (ref 0.00–0.07)
Basophils Absolute: 0 10*3/uL (ref 0.0–0.1)
Basophils Relative: 1 %
Eosinophils Absolute: 0.2 10*3/uL (ref 0.0–0.5)
Eosinophils Relative: 3 %
HCT: 37 % (ref 36.0–46.0)
Hemoglobin: 12.6 g/dL (ref 12.0–15.0)
Immature Granulocytes: 0 %
Lymphocytes Relative: 36 %
Lymphs Abs: 2.6 10*3/uL (ref 0.7–4.0)
MCH: 31.6 pg (ref 26.0–34.0)
MCHC: 34.1 g/dL (ref 30.0–36.0)
MCV: 92.7 fL (ref 80.0–100.0)
Monocytes Absolute: 0.5 10*3/uL (ref 0.1–1.0)
Monocytes Relative: 7 %
Neutro Abs: 4 10*3/uL (ref 1.7–7.7)
Neutrophils Relative %: 53 %
Platelets: 231 10*3/uL (ref 150–400)
RBC: 3.99 MIL/uL (ref 3.87–5.11)
RDW: 13.1 % (ref 11.5–15.5)
WBC: 7.3 10*3/uL (ref 4.0–10.5)
nRBC: 0 % (ref 0.0–0.2)

## 2020-06-10 LAB — COMPREHENSIVE METABOLIC PANEL WITH GFR
ALT: 316 U/L — ABNORMAL HIGH (ref 0–44)
AST: 123 U/L — ABNORMAL HIGH (ref 15–41)
Albumin: 3.2 g/dL — ABNORMAL LOW (ref 3.5–5.0)
Alkaline Phosphatase: 121 U/L (ref 38–126)
Anion gap: 7 (ref 5–15)
BUN: <5 mg/dL — ABNORMAL LOW (ref 6–20)
CO2: 25 mmol/L (ref 22–32)
Calcium: 8.8 mg/dL — ABNORMAL LOW (ref 8.9–10.3)
Chloride: 104 mmol/L (ref 98–111)
Creatinine, Ser: 0.61 mg/dL (ref 0.44–1.00)
GFR calc Af Amer: >60 mL/min
GFR calc non Af Amer: >60 mL/min
Glucose, Bld: 93 mg/dL (ref 70–99)
Potassium: 3.1 mmol/L — ABNORMAL LOW (ref 3.5–5.1)
Sodium: 136 mmol/L (ref 135–145)
Total Bilirubin: 1.2 mg/dL (ref 0.3–1.2)
Total Protein: 6.9 g/dL (ref 6.5–8.1)

## 2020-06-10 LAB — ETHANOL: Alcohol, Ethyl (B): <10 mg/dL

## 2020-06-10 LAB — SURGICAL PCR SCREEN
MRSA, PCR: NEGATIVE
Staphylococcus aureus: POSITIVE — AB

## 2020-06-10 LAB — MAGNESIUM: Magnesium: 1.6 mg/dL — ABNORMAL LOW (ref 1.7–2.4)

## 2020-06-10 LAB — PHOSPHORUS: Phosphorus: 2.6 mg/dL (ref 2.5–4.6)

## 2020-06-10 LAB — CK: Total CK: 126 U/L (ref 38–234)

## 2020-06-10 SURGERY — LAPAROSCOPIC CHOLECYSTECTOMY WITH INTRAOPERATIVE CHOLANGIOGRAM
Anesthesia: General | Site: Abdomen

## 2020-06-10 MED ORDER — ACETAMINOPHEN 500 MG PO TABS: 1000.0000 mg | ORAL_TABLET | Freq: Four times a day (QID) | ORAL | Status: DC

## 2020-06-10 MED ORDER — MAGNESIUM SULFATE 2 GM/50ML IV SOLN
2.0000 g | Freq: Once | INTRAVENOUS | Status: AC
  Administered 2020-06-10: 2 g via INTRAVENOUS
  Filled 2020-06-10: qty 50

## 2020-06-10 MED ORDER — PHENYLEPHRINE 40 MCG/ML (10ML) SYRINGE FOR IV PUSH (FOR BLOOD PRESSURE SUPPORT)
PREFILLED_SYRINGE | INTRAVENOUS | Status: DC | PRN
  Administered 2020-06-10: 80 ug via INTRAVENOUS
  Administered 2020-06-10: 40 ug via INTRAVENOUS
  Administered 2020-06-10 (×2): 80 ug via INTRAVENOUS
  Administered 2020-06-10: 120 ug via INTRAVENOUS

## 2020-06-10 MED ORDER — 0.9 % SODIUM CHLORIDE (POUR BTL) OPTIME
TOPICAL | Status: DC | PRN
  Administered 2020-06-10: 1000 mL

## 2020-06-10 MED ORDER — STERILE WATER FOR IRRIGATION IR SOLN
Status: DC | PRN
  Administered 2020-06-10: 1000 mL

## 2020-06-10 MED ORDER — OXYCODONE HCL 5 MG PO TABS
5.0000 mg | ORAL_TABLET | ORAL | Status: DC | PRN
  Administered 2020-06-11: 5 mg via ORAL
  Filled 2020-06-10: qty 1

## 2020-06-10 MED ORDER — LIDOCAINE 2% (20 MG/ML) 5 ML SYRINGE
INTRAMUSCULAR | Status: DC | PRN
  Administered 2020-06-10: 60 mg via INTRAVENOUS

## 2020-06-10 MED ORDER — ACETAMINOPHEN 325 MG PO TABS
650.0000 mg | ORAL_TABLET | Freq: Four times a day (QID) | ORAL | Status: DC
  Administered 2020-06-10 – 2020-06-11 (×3): 650 mg via ORAL
  Filled 2020-06-10 (×3): qty 2

## 2020-06-10 MED ORDER — KETOROLAC TROMETHAMINE 30 MG/ML IJ SOLN
INTRAMUSCULAR | Status: AC
  Filled 2020-06-10: qty 1

## 2020-06-10 MED ORDER — HYDROMORPHONE HCL 1 MG/ML IJ SOLN: 0.2500 mg | INTRAMUSCULAR | Status: DC | PRN

## 2020-06-10 MED ORDER — LACTATED RINGERS IV SOLN: INTRAVENOUS | Status: DC

## 2020-06-10 MED ORDER — SUGAMMADEX SODIUM 200 MG/2ML IV SOLN
INTRAVENOUS | Status: DC | PRN
  Administered 2020-06-10: 200 mg via INTRAVENOUS

## 2020-06-10 MED ORDER — ONDANSETRON HCL 4 MG/2ML IJ SOLN
INTRAMUSCULAR | Status: DC | PRN
  Administered 2020-06-10: 4 mg via INTRAVENOUS

## 2020-06-10 MED ORDER — ROCURONIUM BROMIDE 10 MG/ML (PF) SYRINGE
PREFILLED_SYRINGE | INTRAVENOUS | Status: AC
  Filled 2020-06-10: qty 10

## 2020-06-10 MED ORDER — SODIUM CHLORIDE 0.9 % IR SOLN
Status: DC | PRN
  Administered 2020-06-10: 1000 mL

## 2020-06-10 MED ORDER — GABAPENTIN 100 MG PO CAPS
200.0000 mg | ORAL_CAPSULE | ORAL | Status: AC
  Administered 2020-06-10: 200 mg via ORAL
  Filled 2020-06-10: qty 2

## 2020-06-10 MED ORDER — MIDAZOLAM HCL 2 MG/2ML IJ SOLN
INTRAMUSCULAR | Status: AC
  Filled 2020-06-10: qty 2

## 2020-06-10 MED ORDER — ONDANSETRON HCL 4 MG/2ML IJ SOLN
INTRAMUSCULAR | Status: AC
  Filled 2020-06-10: qty 2

## 2020-06-10 MED ORDER — LIDOCAINE 2% (20 MG/ML) 5 ML SYRINGE
INTRAMUSCULAR | Status: AC
  Filled 2020-06-10: qty 5

## 2020-06-10 MED ORDER — MIDAZOLAM HCL 2 MG/2ML IJ SOLN
INTRAMUSCULAR | Status: DC | PRN
  Administered 2020-06-10: 2 mg via INTRAVENOUS

## 2020-06-10 MED ORDER — ROCURONIUM BROMIDE 10 MG/ML (PF) SYRINGE
PREFILLED_SYRINGE | INTRAVENOUS | Status: DC | PRN
  Administered 2020-06-10: 60 mg via INTRAVENOUS

## 2020-06-10 MED ORDER — CIPROFLOXACIN IN D5W 400 MG/200ML IV SOLN
400.0000 mg | INTRAVENOUS | Status: AC
  Administered 2020-06-10: 400 mg via INTRAVENOUS
  Filled 2020-06-10 (×2): qty 200

## 2020-06-10 MED ORDER — HEPARIN SODIUM (PORCINE) 5000 UNIT/ML IJ SOLN
5000.0000 [IU] | Freq: Three times a day (TID) | INTRAMUSCULAR | Status: DC
  Administered 2020-06-10 – 2020-06-11 (×2): 5000 [IU] via SUBCUTANEOUS
  Filled 2020-06-10 (×2): qty 1

## 2020-06-10 MED ORDER — BUPIVACAINE HCL (PF) 0.25 % IJ SOLN
INTRAMUSCULAR | Status: AC
  Filled 2020-06-10: qty 30

## 2020-06-10 MED ORDER — SODIUM CHLORIDE 0.9 % IV SOLN
INTRAVENOUS | Status: DC | PRN
  Administered 2020-06-10: 100 mL

## 2020-06-10 MED ORDER — FENTANYL CITRATE (PF) 250 MCG/5ML IJ SOLN
INTRAMUSCULAR | Status: DC | PRN
  Administered 2020-06-10: 100 ug via INTRAVENOUS
  Administered 2020-06-10: 50 ug via INTRAVENOUS

## 2020-06-10 MED ORDER — KETOROLAC TROMETHAMINE 30 MG/ML IJ SOLN
INTRAMUSCULAR | Status: DC | PRN
Start: 2020-06-10 — End: 2020-06-10
  Administered 2020-06-10: 30 mg via INTRAVENOUS

## 2020-06-10 MED ORDER — DEXAMETHASONE SODIUM PHOSPHATE 10 MG/ML IJ SOLN
INTRAMUSCULAR | Status: DC | PRN
  Administered 2020-06-10: 10 mg via INTRAVENOUS

## 2020-06-10 MED ORDER — DEXAMETHASONE SODIUM PHOSPHATE 10 MG/ML IJ SOLN
INTRAMUSCULAR | Status: AC
  Filled 2020-06-10: qty 1

## 2020-06-10 MED ORDER — CHLORHEXIDINE GLUCONATE CLOTH 2 % EX PADS
6.0000 | MEDICATED_PAD | Freq: Every day | CUTANEOUS | Status: DC
  Administered 2020-06-10: 6 via TOPICAL

## 2020-06-10 MED ORDER — CHLORHEXIDINE GLUCONATE 0.12 % MT SOLN
15.0000 mL | Freq: Once | OROMUCOSAL | Status: AC
  Administered 2020-06-10: 15 mL via OROMUCOSAL
  Filled 2020-06-10: qty 15

## 2020-06-10 MED ORDER — POTASSIUM CHLORIDE CRYS ER 20 MEQ PO TBCR
60.0000 meq | EXTENDED_RELEASE_TABLET | Freq: Once | ORAL | Status: AC
  Administered 2020-06-10: 60 meq via ORAL
  Filled 2020-06-10: qty 3

## 2020-06-10 MED ORDER — ORAL CARE MOUTH RINSE: 15.0000 mL | Freq: Once | OROMUCOSAL | Status: AC

## 2020-06-10 MED ORDER — ACETAMINOPHEN 500 MG PO TABS
1000.0000 mg | ORAL_TABLET | ORAL | Status: AC
  Administered 2020-06-10: 1000 mg via ORAL
  Filled 2020-06-10: qty 2

## 2020-06-10 MED ORDER — PROPOFOL 10 MG/ML IV BOLUS
INTRAVENOUS | Status: AC
  Filled 2020-06-10: qty 40

## 2020-06-10 MED ORDER — BUPIVACAINE HCL (PF) 0.25 % IJ SOLN
INTRAMUSCULAR | Status: DC | PRN
  Administered 2020-06-10: 23 mL

## 2020-06-10 MED ORDER — KETOROLAC TROMETHAMINE 15 MG/ML IJ SOLN: 15.0000 mg | Freq: Four times a day (QID) | INTRAMUSCULAR | Status: DC | PRN

## 2020-06-10 MED ORDER — FENTANYL CITRATE (PF) 250 MCG/5ML IJ SOLN
INTRAMUSCULAR | Status: AC
  Filled 2020-06-10: qty 5

## 2020-06-10 MED ORDER — PROPOFOL 10 MG/ML IV BOLUS
INTRAVENOUS | Status: DC | PRN
  Administered 2020-06-10: 120 mg via INTRAVENOUS

## 2020-06-10 SURGICAL SUPPLY — 47 items
APPLIER CLIP 5 13 M/L LIGAMAX5 (MISCELLANEOUS) ×2
BENZOIN TINCTURE PRP APPL 2/3 (GAUZE/BANDAGES/DRESSINGS) IMPLANT
BLADE CLIPPER SURG (BLADE) IMPLANT
BNDG ADH 1X3 SHEER STRL LF (GAUZE/BANDAGES/DRESSINGS) IMPLANT
CANISTER SUCT 3000ML PPV (MISCELLANEOUS) ×2 IMPLANT
CHLORAPREP W/TINT 26 (MISCELLANEOUS) ×2 IMPLANT
CLIP APPLIE 5 13 M/L LIGAMAX5 (MISCELLANEOUS) ×1 IMPLANT
COVER MAYO STAND STRL (DRAPES) ×2 IMPLANT
COVER SURGICAL LIGHT HANDLE (MISCELLANEOUS) ×2 IMPLANT
COVER WAND RF STERILE (DRAPES) IMPLANT
DRAPE C-ARM 42X120 X-RAY (DRAPES) ×2 IMPLANT
DRSG TEGADERM 2-3/8X2-3/4 SM (GAUZE/BANDAGES/DRESSINGS) ×6 IMPLANT
DRSG TEGADERM 4X4.75 (GAUZE/BANDAGES/DRESSINGS) ×2 IMPLANT
ELECT REM PT RETURN 9FT ADLT (ELECTROSURGICAL) ×2
ELECTRODE REM PT RTRN 9FT ADLT (ELECTROSURGICAL) ×1 IMPLANT
GAUZE SPONGE 2X2 8PLY STRL LF (GAUZE/BANDAGES/DRESSINGS) ×1 IMPLANT
GLOVE BIOGEL M STRL SZ7.5 (GLOVE) ×2 IMPLANT
GLOVE ECLIPSE 6.0 STRL STRAW (GLOVE) ×2 IMPLANT
GLOVE INDICATOR 6.5 STRL GRN (GLOVE) ×2 IMPLANT
GLOVE INDICATOR 8.0 STRL GRN (GLOVE) ×4 IMPLANT
GOWN STRL REUS W/ TWL LRG LVL3 (GOWN DISPOSABLE) ×3 IMPLANT
GOWN STRL REUS W/TWL 2XL LVL3 (GOWN DISPOSABLE) ×2 IMPLANT
GOWN STRL REUS W/TWL LRG LVL3 (GOWN DISPOSABLE) ×3
GRASPER SUT TROCAR 14GX15 (MISCELLANEOUS) IMPLANT
KIT BASIN OR (CUSTOM PROCEDURE TRAY) ×2 IMPLANT
KIT TURNOVER KIT B (KITS) ×2 IMPLANT
NS IRRIG 1000ML POUR BTL (IV SOLUTION) ×2 IMPLANT
PAD ARMBOARD 7.5X6 YLW CONV (MISCELLANEOUS) ×2 IMPLANT
POUCH RETRIEVAL ECOSAC 10 (ENDOMECHANICALS) ×1 IMPLANT
POUCH RETRIEVAL ECOSAC 10MM (ENDOMECHANICALS) ×1
SCISSORS LAP 5X35 DISP (ENDOMECHANICALS) ×2 IMPLANT
SET CHOLANGIOGRAPH 5 50 .035 (SET/KITS/TRAYS/PACK) ×2 IMPLANT
SET IRRIG TUBING LAPAROSCOPIC (IRRIGATION / IRRIGATOR) ×2 IMPLANT
SET TUBE SMOKE EVAC HIGH FLOW (TUBING) ×2 IMPLANT
SLEEVE ENDOPATH XCEL 5M (ENDOMECHANICALS) ×4 IMPLANT
SPECIMEN JAR SMALL (MISCELLANEOUS) ×2 IMPLANT
SPONGE GAUZE 2X2 STER 10/PKG (GAUZE/BANDAGES/DRESSINGS) ×1
STRIP CLOSURE SKIN 1/2X4 (GAUZE/BANDAGES/DRESSINGS) ×2 IMPLANT
SUT MNCRL AB 4-0 PS2 18 (SUTURE) ×2 IMPLANT
SUT VIC AB 0 UR5 27 (SUTURE) IMPLANT
SUT VICRYL 0 UR6 27IN ABS (SUTURE) ×2 IMPLANT
TOWEL GREEN STERILE (TOWEL DISPOSABLE) ×2 IMPLANT
TOWEL GREEN STERILE FF (TOWEL DISPOSABLE) IMPLANT
TRAY LAPAROSCOPIC MC (CUSTOM PROCEDURE TRAY) ×2 IMPLANT
TROCAR XCEL BLUNT TIP 100MML (ENDOMECHANICALS) ×2 IMPLANT
TROCAR XCEL NON-BLD 5MMX100MML (ENDOMECHANICALS) ×2 IMPLANT
WATER STERILE IRR 1000ML POUR (IV SOLUTION) ×2 IMPLANT

## 2020-06-10 NOTE — Progress Notes (Addendum)
Patient ID: Carly Ramos, female   DOB: Mar 25, 1986, 34 y.o.   MRN: [id number]   Acute Care Surgery Service Progress Note:    Chief Complaint/Subjective: No abd pain No n/v  Objective: Vital signs in last 24 hours: Temp:  [97.8 F (36.6 C)-98 F (36.7 C)] 97.8 F (36.6 C) (08/04 0754) Pulse Rate:  [79-92] 92 (08/04 0754) Resp:  [14-19] 19 (08/04 0754) BP: (132-148)/(82-106) 148/106 (08/04 0754) SpO2:  [95 %-100 %] 100 % (08/04 0754) Last BM Date: 06/08/20  Intake/Output from previous day: 08/03 0701 - 08/04 0700 In: 4 [I.V.:4] Out: -  Intake/Output this shift: No intake/output data recorded.  Lungs: cta, nonlabored  Cardiovascular: reg  Abd: soft, obese, NT  Extremities: no edema, +SCDs  Neuro: alert, nonfocal  Lab Results: CBC  Recent Labs    06/09/20 0609 06/10/20 0442  WBC 6.8 7.3  HGB 11.9* 12.6  HCT 35.0* 37.0  PLT 226 231   BMET Recent Labs    06/09/20 0609 06/10/20 0442  NA 137 136  K 2.9* 3.1*  CL 105 104  CO2 25 25  GLUCOSE 112* 93  BUN <5* <5*  CREATININE 0.64 0.61  CALCIUM 8.5* 8.8*   LFT Hepatic Function Latest Ref Rng & Units 06/10/2020 06/09/2020 06/08/2020  Total Protein 6.5 - 8.1 g/dL 6.9 6.5 7.1  Albumin 3.5 - 5.0 g/dL 3.2(L) 3.2(L) 3.7  AST 15 - 41 U/L 123(H) 330(H) 671(H)  ALT 0 - 44 U/L 316(H) 445(H) 385(H)  Alk Phosphatase 38 - 126 U/L 121 126 119  Total Bilirubin 0.3 - 1.2 mg/dL 1.2 3.4(H) 1.9(H)   PT/INR Recent Labs    06/08/20 2000  LABPROT 13.9  INR 1.1   ABG No results for input(s): PHART, HCO3 in the last 72 hours.  Invalid input(s): PCO2, PO2  Studies/Results:  Anti-infectives: Anti-infectives (From admission, onward)   Start     Dose/Rate Route Frequency Ordered Stop   06/10/20 0830  ciprofloxacin (CIPRO) IVPB 400 mg     Discontinue     400 mg 200 mL/hr over 60 Minutes Intravenous On call to O.R. 06/10/20 0819 06/11/20 0559      Medications: Scheduled Meds: . acetaminophen  1,000 mg Oral On Call to OR   . atenolol  25 mg Oral Daily  . Chlorhexidine Gluconate Cloth  6 each Topical Daily  . docusate sodium  100 mg Oral BID  . gabapentin  200 mg Oral On Call to OR  . heparin injection (subcutaneous)  5,000 Units Subcutaneous Q8H  . mupirocin ointment  1 application Nasal BID  . sodium chloride flush  3 mL Intravenous Q12H   Continuous Infusions: . sodium chloride 50 mL/hr at 06/09/20 1508  . ciprofloxacin     PRN Meds:.ondansetron **OR** ondansetron (ZOFRAN) IV  Assessment/Plan: Patient Active Problem List   Diagnosis Date Noted  . Cholelithiasis 06/08/2020  . Essential hypertension 06/08/2020  . Elevated LFTs 06/08/2020  . Dehydration 06/08/2020  . Hypokalemia 06/08/2020  . AF (paroxysmal atrial fibrillation) ([place]) 06/08/2020   Cholelithiasis with possible cholecystitis. Hypokalemia Elevated transaminases Hyperbilirubinemia hypomagnesia  Her LFTs are normalizing I do think she has sympt cholelithiasis and probably choelcystitis Have recommended IV abx and cholecystectomy.   I believe the patient's symptoms are consistent with gallbladder disease.  We discussed gallbladder disease. The patient was given Neurosurgeon. We discussed non-operative and operative management. We discussed the signs & symptoms of acute cholecystitis  I discussed laparoscopic cholecystectomy with IOC in detail.  The patient was  given diagrams detailing the procedure.  We discussed the risks and benefits of a laparoscopic cholecystectomy including, but not limited to bleeding, infection, injury to surrounding structures such as the intestine or liver, bile leak, retained gallstones, need to convert to an open procedure, prolonged diarrhea, blood clots such as  DVT, common bile duct injury, anesthesia risks, and possible need for additional procedures.  We discussed the typical post-operative recovery course. I explained that the likelihood of improvement of their symptoms is good.  Disposition:  OR later this am, give subcu heparin, IV abx, replace potassium, possible home later today if intraop IOC negative and meets dc criteria; replace magnesium    LOS: 1 day    Leighton Ruff. Redmond Pulling, MD, FACS General, Bariatric, & Minimally Invasive Surgery [phone] [facility]

## 2020-06-10 NOTE — Op Note (Signed)
Carly Ramos [id number] 01/06/1986 06/10/2020  Laparoscopic Cholecystectomy with attempted IOC Procedure Note  Indications: This patient presents with symptomatic gallbladder disease and will undergo laparoscopic cholecystectomy.  She was admitted the other day with abdominal pain and nausea and vomiting.  She was found to have an elevated bilirubin as well as transaminases.  An ultrasound showed numerous gallstones as well as common bile duct at the upper limits of diameter.  She underwent a CT scan followed by an MRCP.  MRI was concerning for gallstones as well as suspicion for cholecystitis.  The common bile duct was normal caliber and course without any evidence of common bile duct stones.  Her LFTs started to decrease and so we were asked to consider cholecystectomy.  Please see chart for additional information as well as discussion regarding risk and benefits of surgery  Pre-operative Diagnosis: symptomatic cholelithiasis  Post-operative Diagnosis: same + early acute calculous cholecystitis  Surgeon: Greer Pickerel MD FACS  Assistants: Melina Modena PA-C  Anesthesia: General endotracheal anesthesia  Procedure Details  The patient was seen again in the Holding Room. The risks, benefits, complications, treatment options, and expected outcomes were discussed with the patient. The possibilities of reaction to medication, pulmonary aspiration, perforation of viscus, bleeding, recurrent infection, finding a normal gallbladder, the need for additional procedures, failure to diagnose a condition, the possible need to convert to an open procedure, and creating a complication requiring transfusion or operation were discussed with the patient. The likelihood of improving the patient's symptoms with return to their baseline status is good.  The patient and/or family concurred with the proposed plan, giving informed consent. The site of surgery properly noted. The patient was taken to Operating Room, identified  as Carly Ramos and the procedure verified as Laparoscopic Cholecystectomy with Intraoperative Cholangiogram. A Time Out was held and the above information confirmed. Antibiotic prophylaxis was administered.   Prior to the induction of general anesthesia, antibiotic prophylaxis was administered. General endotracheal anesthesia was then administered and tolerated well. After the induction, the abdomen was prepped with Chloraprep and draped in the sterile fashion. The patient was positioned in the supine position.  Local anesthetic agent was injected into the skin near the umbilicus and an incision made. We dissected down to the abdominal fascia with blunt dissection.  The fascia was incised vertically and we entered the peritoneal cavity bluntly.  A pursestring suture of 0-Vicryl was placed around the fascial opening.  The Hasson cannula was inserted and secured with the stay suture.  Pneumoperitoneum was then created with CO2 and tolerated well without any adverse changes in the patient's vital signs. An 5-mm port was placed in the subxiphoid position.  Two 5-mm ports were placed in the right upper quadrant. All skin incisions were infiltrated with a local anesthetic agent before making the incision and placing the trocars.   We positioned the patient in reverse Trendelenburg, tilted slightly to the patient's left.  The gallbladder was identified, the fundus grasped and retracted cephalad. Adhesions were lysed bluntly and with the electrocautery where indicated, taking care not to injure any adjacent organs or viscus. The infundibulum was grasped and retracted laterally, exposing the peritoneum overlying the triangle of Calot. This was then divided and exposed in a blunt fashion. A critical view of the cystic duct and cystic artery was obtained.  The cystic duct was clearly identified and bluntly dissected circumferentially. The cystic duct was ligated with a clip distally.   An incision was made in the  cystic duct and  the [facility] cholangiogram catheter introduced. The catheter was secured using a clip.  Upon flushing the cholangiogram catheter with saline I met significant resistance.  I removed the clip and reposition the catheter as well as milked the cystic duct and common bile duct gently.  I was able to express some bile from the ductotomy.  I reposition the catheter and secured it with a clip and again I met resistance with flushing the saline.  I again reposition of the catheter after removing the clip and again met resistance with flushing the catheter with saline.  I tried one additional time and had the same result.  At this point I decided to not proceed with a cholangiogram.  She had had a preoperative MRCP that demonstrated normal common bile duct.  I did not see any obvious stone in the cystic duct stump.  I was able to milk some bile out of the cystic duct. . The catheter was then removed.   The cystic duct was then ligated with clips and divided. The cystic artery which had been identified & dissected free was ligated with clips and divided as well.   The gallbladder was dissected from the liver bed in retrograde fashion with the electrocautery. The gallbladder was removed and placed in an Ecco sac.  The gallbladder and Ecco sac were then removed through the umbilical port site. The liver bed was irrigated and inspected. Hemostasis was achieved with the electrocautery. Copious irrigation was utilized and was repeatedly aspirated until clear.  The pursestring suture was used to close the umbilical fascia.    We again inspected the right upper quadrant for hemostasis.  The umbilical closure was inspected and there was no air leak and nothing trapped within the closure.  I did place an additional interrupted 0 Vicryl suture at the umbilical fascia with a PMI suture passer with laparoscopic guidance.  Local was infiltrated around the umbilicus as well as along the right lateral abdominal wall as a  tap block.  Pneumoperitoneum was released as we removed the trocars.  4-0 Monocryl was used to close the skin.   , steri-strips, and clean dressings were applied. The patient was then extubated and brought to the recovery room in stable condition. Instrument, sponge, and needle counts were correct at closure and at the conclusion of the case.   Findings: Early Cholecystitis with Cholelithiasis +critical view  Estimated Blood Loss: Minimal         Drains: none         Specimens: Gallbladder           Complications: None; patient tolerated the procedure well.         Disposition: PACU - hemodynamically stable.         Condition: stable  Leighton Ruff. Redmond Pulling, MD, FACS General, Bariatric, & Minimally Invasive Surgery [facility], [place]

## 2020-06-10 NOTE — Plan of Care (Signed)
  Problem: Education: Goal: Knowledge of General Education information will improve Description: Including pain rating scale, medication(s)/side effects and non-pharmacologic comfort measures Outcome: Progressing   Problem: Health Behavior/Discharge Planning: Goal: Ability to manage health-related needs will improve Outcome: Progressing   Problem: Clinical Measurements: Goal: Ability to maintain clinical measurements within normal limits will improve Outcome: Progressing Goal: Will remain free from infection Outcome: Progressing Goal: Diagnostic test results will improve Outcome: Progressing Goal: Respiratory complications will improve Outcome: Progressing Goal: Cardiovascular complication will be avoided Outcome: Progressing   Problem: Clinical Measurements: Goal: Will remain free from infection Outcome: Progressing   Problem: Clinical Measurements: Goal: Diagnostic test results will improve Outcome: Progressing   Problem: Nutrition: Goal: Adequate nutrition will be maintained Outcome: Progressing   Problem: Safety: Goal: Ability to remain free from injury will improve Outcome: Progressing

## 2020-06-10 NOTE — Progress Notes (Signed)
PROGRESS NOTE    Carly Ramos  OVF:[id number] DOB: 02/05/1986 DOA: 06/08/2020 PCP: Patient, No Pcp Per      Brief Narrative:  Carly Ramos is a 34 y.o. F with HTN, obesity who presented with abdominal pain, found to have cholelithiasis with cholecystitis.  Surgery was consulted recommended cholecystectomy.       Assessment & Plan:  Acute cholecystitis General Surgery plan for cholecystectomy this afternoon. -Consult General Surgery, appreciate cares -Trend CMP and CBC postop   Hypokalemia Hypomagnesemia -Supplement potassium and magneseium  Hypertension Blood pressure controlled -Continue atenolol -Hold home amlodipine and aspirin          Disposition: Status is: Inpatient  Remains inpatient appropriate because:Ongoing diagnostic testing needed not appropriate for outpatient work up   Dispo: The patient is from: Home              Anticipated d/c is to: Home              Anticipated d/c date is: > 3 days              Patient currently is not medically stable to d/c.              MDM: The below labs and imaging reports were reviewed and summarized above.  Medication management as above.    DVT prophylaxis: heparin injection 5,000 Units Start: 06/10/20 1400 SCDs Start: 06/08/20 2227  Code Status: FULl Family Communication:     Consultants:   General Surgery  Procedures:     Antimicrobials:      Culture data:              Subjective: Patient feeling well mostly.  No confusion, fever, vomiting.  Objective: Vitals:   06/10/20 1425 06/10/20 1440 06/10/20 1455 06/10/20 1652  BP: 138/80 (!) 147/89 137/87 122/76  Pulse: (!) 51 (!) 54 60 67  Resp: 17 17 14 20   Temp:   98.2 F (36.8 C) 98.3 F (36.8 C)  TempSrc:      SpO2: 97% 98% 95% 98%  Weight:      Height:        Intake/Output Summary (Last 24 hours) at 06/10/2020 2031 Last data filed at 06/10/2020 1457 Gross per 24 hour  Intake 1000 ml  Output 120 ml  Net  880 ml   Filed Weights   06/08/20 1044  Weight: 81.6 kg    Examination: General appearance:  adult female, alert and in no acutedistress.   HEENT: Anicteric, conjunctiva pink, lids and lashes normal. No nasal deformity, discharge, epistaxis.  Lips moist.   Skin: Warm and dry.  No jaundice.  No suspicious rashes or lesions. Cardiac: RRR, nl S1-S2, no murmurs appreciated.  Capillary refill is brisk.  JVP normal  No LE edema.  Radial  pulses 2+ and symmetric. Respiratory: Normal respiratory rate and rhythm.  CTAB without rales or wheezes. Abdomen: Abdomen soft.  Mild right upper quadrant TTP without guarding. No ascites, distension, hepatosplenomegaly.   MSK: No deformities or effusions. Neuro: Awake and alert.  EOMI, moves all extremities. Speech fluent.    Psych: Sensorium intact and responding to questions, attention normal. Affect normal.  Judgment and insight appear normal.    Data Reviewed: I have personally reviewed following labs and imaging studies:  CBC: Recent Labs  Lab 06/08/20 1048 06/09/20 0609 06/10/20 0442  WBC 8.0 6.8 7.3  NEUTROABS  --  4.0 4.0  HGB 13.4 11.9* 12.6  HCT 38.8 35.0* 37.0  MCV 93.0 93.1  92.7  PLT 259 226 400   Basic Metabolic Panel: Recent Labs  Lab 06/08/20 1048 06/09/20 0609 06/10/20 0442  NA 138 137 136  K 2.8* 2.9* 3.1*  CL 102 105 104  CO2 24 25 25   GLUCOSE 133* 112* 93  BUN 7 <5* <5*  CREATININE 0.68 0.64 0.61  CALCIUM 9.1 8.5* 8.8*  MG  --  1.9 1.6*  PHOS  --  2.9 2.6   GFR: Estimated Creatinine Clearance: 100.3 mL/min (by C-G formula based on SCr of 0.61 mg/dL). Liver Function Tests: Recent Labs  Lab 06/08/20 1048 06/09/20 0609 06/10/20 0442  AST 671* 330* 123*  ALT 385* 445* 316*  ALKPHOS 119 126 121  BILITOT 1.9* 3.4* 1.2  PROT 7.1 6.5 6.9  ALBUMIN 3.7 3.2* 3.2*   Recent Labs  Lab 06/08/20 1048  LIPASE 37   Recent Labs  Lab 06/08/20 2222  AMMONIA 35   Coagulation Profile: Recent Labs  Lab  06/08/20 2000  INR 1.1   Cardiac Enzymes: Recent Labs  Lab 06/10/20 0442  CKTOTAL 126   BNP (last 3 results) No results for input(s): PROBNP in the last 8760 hours. HbA1C: No results for input(s): HGBA1C in the last 72 hours. CBG: No results for input(s): GLUCAP in the last 168 hours. Lipid Profile: Recent Labs    06/09/20 0609  CHOL 109  HDL 42  LDLCALC 58  TRIG 46  CHOLHDL 2.6   Thyroid Function Tests: Recent Labs    06/09/20 0609  TSH 2.575   Anemia Panel: No results for input(s): VITAMINB12, FOLATE, FERRITIN, TIBC, IRON, RETICCTPCT in the last 72 hours. Urine analysis:    Component Value Date/Time   COLORURINE YELLOW 06/08/2020 1730   APPEARANCEUR HAZY (A) 06/08/2020 1730   LABSPEC 1.002 (L) 06/08/2020 1730   PHURINE 7.0 06/08/2020 1730   GLUCOSEU NEGATIVE 06/08/2020 1730   HGBUR SMALL (A) 06/08/2020 1730   BILIRUBINUR NEGATIVE 06/08/2020 1730   KETONESUR NEGATIVE 06/08/2020 1730   PROTEINUR NEGATIVE 06/08/2020 1730   NITRITE NEGATIVE 06/08/2020 1730   LEUKOCYTESUR NEGATIVE 06/08/2020 1730   Sepsis Labs: @LABRCNTIP (procalcitonin:4,lacticacidven:4)  ) Recent Results (from the past 240 hour(s))  SARS Coronavirus 2 by RT PCR (hospital order, performed in [facility] hospital lab) Nasopharyngeal Nasopharyngeal Swab     Status: None   Collection Time: 06/08/20  9:36 PM   Specimen: Nasopharyngeal Swab  Result Value Ref Range Status   SARS Coronavirus 2 NEGATIVE NEGATIVE Final    Comment: (NOTE) SARS-CoV-2 target nucleic acids are NOT DETECTED.  The SARS-CoV-2 RNA is generally detectable in upper and lower respiratory specimens during the acute phase of infection. The lowest concentration of SARS-CoV-2 viral copies this assay can detect is 250 copies / mL. A negative result does not preclude SARS-CoV-2 infection and should not be used as the sole basis for treatment or other patient management decisions.  A negative result may occur with improper  specimen collection / handling, submission of specimen other than nasopharyngeal swab, presence of viral mutation(s) within the areas targeted by this assay, and inadequate number of viral copies (<250 copies / mL). A negative result must be combined with clinical observations, patient history, and epidemiological information.  Fact Sheet for Patients:   StrictlyIdeas.no  Fact Sheet for Healthcare Providers: BankingDealers.co.za  This test is not yet approved or  cleared by the [place] FDA and has been authorized for detection and/or diagnosis of SARS-CoV-2 by FDA under an Emergency Use Authorization (EUA).  This EUA will  remain in effect (meaning this test can be used) for the duration of the COVID-19 declaration under Section 564(b)(1) of the Act, 21 U.S.C. section 360bbb-3(b)(1), unless the authorization is terminated or revoked sooner.  Performed at [facility], [place] [address], [place], [place] [address]   Surgical PCR screen     Status: Abnormal   Collection Time: 06/09/20  8:32 PM   Specimen: Nasal Mucosa; Nasal Swab  Result Value Ref Range Status   MRSA, PCR NEGATIVE NEGATIVE Final   Staphylococcus aureus POSITIVE (A) NEGATIVE Final    Comment: (NOTE) The Xpert SA Assay (FDA approved for NASAL specimens in patients 62 years of age and older), is one component of a comprehensive surveillance program. It is not intended to diagnose infection nor to guide or monitor treatment. Performed at [facility], [place] [address], [place], [place] [address]          Radiology Studies: CT ABDOMEN PELVIS W CONTRAST  Result Date: 06/08/2020 CLINICAL DATA:  34 year old female with abdominal pain. EXAM: CT ABDOMEN AND PELVIS WITH CONTRAST TECHNIQUE: Multidetector CT imaging of the abdomen and pelvis was performed using the standard protocol following bolus administration of intravenous contrast. CONTRAST:  130mL  OMNIPAQUE IOHEXOL 300 MG/ML  SOLN COMPARISON:  Abdominal ultrasound dated 06/08/2020 FINDINGS: Lower chest: The visualized lung bases are clear. No intra-abdominal free air or free fluid. Hepatobiliary: There is a 15 mm hypodense lesion in the dome of the liver (10/3) which is suboptimally characterized. The liver is otherwise unremarkable. No intrahepatic biliary ductal dilatation. There is gallstone. There is mild diffuse thickening of the gallbladder wall. Repeat ultrasound may provide better evaluation of the gallbladder if there is continued clinical concern for acute cholecystitis. Pancreas: Unremarkable. No pancreatic ductal dilatation or surrounding inflammatory changes. Spleen: Normal in size without focal abnormality. Adrenals/Urinary Tract: The adrenal glands are unremarkable. The kidneys, visualized ureters, and urinary bladder appear unremarkable. Stomach/Bowel: There is moderate stool throughout the colon. There is no bowel obstruction or active inflammation. The appendix is normal. Vascular/Lymphatic: The abdominal aorta and IVC unremarkable. No portal venous gas. There is no adenopathy. Reproductive: The uterus is enlarged and myomatous. There is a 3.5 cm intracavitary or submucosal fibroid. Ultrasound or MRI may provide better evaluation of the pelvis and uterine fibroids. The ovaries are grossly unremarkable. Other: None Musculoskeletal: No acute or significant osseous findings. IMPRESSION: 1. Cholelithiasis with mild diffuse thickening of the gallbladder wall. Repeat ultrasound may provide better evaluation if there is continued clinical concern for acute cholecystitis. 2. No bowel obstruction. Normal appendix. 3. Enlarged myomatous uterus. Electronically Signed   By: Anner Crete M.D.   On: 06/08/2020 20:54   MR ABDOMEN MRCP W WO CONTAST  Result Date: 06/09/2020 CLINICAL DATA:  Abdominal pain.  Cholelithiasis. EXAM: MRI ABDOMEN WITHOUT AND WITH CONTRAST (INCLUDING MRCP) TECHNIQUE:  Multiplanar multisequence MR imaging of the abdomen was performed both before and after the administration of intravenous contrast. Heavily T2-weighted images of the biliary and pancreatic ducts were obtained, and three-dimensional MRCP images were rendered by post processing. CONTRAST:  26mL GADAVIST GADOBUTROL 1 MMOL/ML IV SOLN COMPARISON:  CT scan 06/08/2020 and ultrasound examination 06/08/2020 FINDINGS: Lower chest: The lung bases are grossly clear. No pleural or pericardial effusion. Hepatobiliary: Simple nonenhancing 14 mm cyst is noted in the left hepatic dome region. No worrisome hepatic lesions or intrahepatic biliary dilatation. Numerous small gallstones are noted the gallbladder. There is also mild gallbladder wall thickening and gallbladder wall edema. Moderate  associated mucosal enhancement is noted. Findings suspicious for acute cholecystitis. Normal caliber and course of the common bile duct. No common bile duct stones. Pancreas:  No mass, inflammation or ductal dilatation. Spleen:  Normal size.  No focal lesions. Adrenals/Urinary Tract: The adrenal glands and kidneys are unremarkable. No worrisome renal lesions or hydronephrosis. Stomach/Bowel: The stomach, duodenum, visualized small bowel and visualize colon are unremarkable. Vascular/Lymphatic: The aorta and branch vessels are patent. The major venous structures are patent. No mesenteric or retroperitoneal mass or adenopathy. Other:  No ascites or abdominal wall hernia. Musculoskeletal: No significant bony findings. IMPRESSION: 1. Cholelithiasis and MRI findings suspicious for acute cholecystitis. Nuclear medicine hepatobiliary scan may be helpful to document cystic duct obstruction. 2. Normal caliber and course of the common bile duct. No common bile duct stones. 3. No acute abdominal findings otherwise and no mass lesions or adenopathy. Electronically Signed   By: Marijo Sanes M.D.   On: 06/09/2020 17:25        Scheduled Meds:   acetaminophen  650 mg Oral Q6H   atenolol  25 mg Oral Daily   Chlorhexidine Gluconate Cloth  6 each Topical Daily   docusate sodium  100 mg Oral BID   heparin injection (subcutaneous)  5,000 Units Subcutaneous Q8H   mupirocin ointment  1 application Nasal BID   sodium chloride flush  3 mL Intravenous Q12H   Continuous Infusions:   LOS: 1 day    Time spent: 25 minutes    Edwin Dada, MD [facility] 06/10/2020, 8:31 PM     Please page though [place] or Epic secure chat:  For [employer], Adult nurse

## 2020-06-10 NOTE — Anesthesia Postprocedure Evaluation (Signed)
Anesthesia Post Note  Patient: Investment banker, operational  Procedure(s) Performed: LAPAROSCOPIC CHOLECYSTECTOMY WITH ATTEMPTED INTRAOPERATIVE CHOLANGIOGRAM (N/A Abdomen)     Patient location during evaluation: PACU Anesthesia Type: General Level of consciousness: awake and alert Pain management: pain level controlled Vital Signs Assessment: post-procedure vital signs reviewed and stable Respiratory status: spontaneous breathing, nonlabored ventilation and respiratory function stable Cardiovascular status: blood pressure returned to baseline and stable Postop Assessment: no apparent nausea or vomiting Anesthetic complications: no   No complications documented.  Last Vitals:  Vitals:   06/10/20 1440 06/10/20 1455  BP: (!) 147/89 137/87  Pulse: (!) 54 60  Resp: 17 14  Temp:  36.8 C  SpO2: 98% 95%    Last Pain:  Vitals:   06/10/20 1440  TempSrc:   PainSc: 0-No pain                 Clerance Umland,W. EDMOND

## 2020-06-10 NOTE — Discharge Instructions (Signed)
Carly Ramos, P.A. LAPAROSCOPIC SURGERY: POST OP INSTRUCTIONS Always review your discharge instruction sheet given to you by the facility where your surgery was performed. IF YOU HAVE DISABILITY OR FAMILY LEAVE FORMS, YOU MUST BRING THEM TO THE OFFICE FOR PROCESSING.   DO NOT GIVE THEM TO YOUR DOCTOR.  PAIN CONTROL  1. First take acetaminophen (Tylenol) AND/or ibuprofen (Advil) to control your pain after surgery.  Follow directions on package.  Taking acetaminophen (Tylenol) and/or ibuprofen (Advil) regularly after surgery will help to control your pain and lower the amount of prescription pain medication you may need.  You should not take more than 3,000 mg (3 grams) of acetaminophen (Tylenol) in 24 hours.  You should not take ibuprofen (Advil), aleve, motrin, naprosyn or other NSAIDS if you have a history of stomach ulcers or chronic kidney disease.  2. A prescription for pain medication may be given to you upon discharge.  Take your pain medication as prescribed, if you still have uncontrolled pain after taking acetaminophen (Tylenol) or ibuprofen (Advil). 3. Use ice packs to help control pain. 4. If you need a refill on your pain medication, please contact your pharmacy.  They will contact our office to request authorization. Prescriptions will not be filled after 5pm or on week-ends.  HOME MEDICATIONS 5. Take your usually prescribed medications unless otherwise directed.  DIET 6. You should follow a light diet the first few days after arrival home.  Be sure to include lots of fluids daily. Avoid fatty, fried foods.   CONSTIPATION 7. It is common to experience some constipation after surgery and if you are taking pain medication.  Increasing fluid intake and taking a stool softener (such as Colace) will usually help or prevent this problem from occurring.  A mild laxative (Milk of Magnesia or Miralax) should be taken according to package instructions if there are no bowel  movements after 48 hours.  WOUND/INCISION CARE 8. Most patients will experience some swelling and bruising in the area of the incisions.  Ice packs will help.  Swelling and bruising can take several days to resolve.  9. Unless discharge instructions indicate otherwise, follow guidelines below  a. STERI-STRIPS - you may remove your outer bandages 48 hours after surgery, and you may shower at that time.  You have steri-strips (small skin tapes) in place directly over the incision.  These strips should be left on the skin for 7-10 days.   b. DERMABOND/SKIN GLUE - you may shower in 24 hours.  The glue will flake off over the next 2-3 weeks. 10. Any sutures or staples will be removed at the office during your follow-up visit.  ACTIVITIES 11. You may resume regular (light) daily activities beginning the next day--such as daily self-care, walking, climbing stairs--gradually increasing activities as tolerated.  You may have sexual intercourse when it is comfortable.  Refrain from any heavy lifting or straining until approved by your doctor. a. You may drive when you are no longer taking prescription pain medication, you can comfortably wear a seatbelt, and you can safely maneuver your car and apply brakes.  FOLLOW-UP 12. You should see your doctor in the office for a follow-up appointment approximately 2-3 weeks after your surgery.  You should have been given your post-op/follow-up appointment when your surgery was scheduled.  If you did not receive a post-op/follow-up appointment, make sure that you call for this appointment within a day or two after you arrive home to insure a convenient appointment time.  OTHER  INSTRUCTIONS 13.   WHEN TO CALL YOUR DOCTOR: 1. Fever over 101.0 2. Inability to urinate 3. Continued bleeding from incision. 4. Increased pain, redness, or drainage from the incision. 5. Increasing abdominal pain  The clinic staff is available to answer your questions during regular  business hours.  Please don't hesitate to call and ask to speak to one of the nurses for clinical concerns.  If you have a medical emergency, go to the nearest emergency room or call 911.  A surgeon from [facility] is always on call at the hospital. [address] [place], [place], [place]  [address] ? [address], [place], [place]   [address] [phone] ? [phone] ? FAX [phone] Web site: [email or web address] ........Marland Kitchen   Managing Your Pain After Surgery Without Opioids    Thank you for participating in our program to help patients manage their pain after surgery without opioids. This is part of our effort to provide you with the best care possible, without exposing you or your family to the risk that opioids pose.  What pain can I expect after surgery? You can expect to have some pain after surgery. This is normal. The pain is typically worse the day after surgery, and quickly begins to get better. Many studies have found that many patients are able to manage their pain after surgery with Over-the-Counter (OTC) medications such as Tylenol and Motrin. If you have a condition that does not allow you to take Tylenol or Motrin, notify your surgical team.  How will I manage my pain? The best strategy for controlling your pain after surgery is around the clock pain control with Tylenol (acetaminophen) and Motrin (ibuprofen or Advil). Alternating these medications with each other allows you to maximize your pain control. In addition to Tylenol and Motrin, you can use heating pads or ice packs on your incisions to help reduce your pain.  How will I alternate your regular strength over-the-counter pain medication? You will take a dose of pain medication every three hours. ; Start by taking 650 mg of Tylenol (2 pills of 325 mg) ; 3 hours later take 600 mg of Motrin (3 pills of 200 mg) ; 3 hours after taking the Motrin take 650 mg of Tylenol ; 3 hours after  that take 600 mg of Motrin.   - 1 -  See example - if your first dose of Tylenol is at 12:00 PM   12:00 PM Tylenol 650 mg (2 pills of 325 mg)  3:00 PM Motrin 600 mg (3 pills of 200 mg)  6:00 PM Tylenol 650 mg (2 pills of 325 mg)  9:00 PM Motrin 600 mg (3 pills of 200 mg)  Continue alternating every 3 hours   We recommend that you follow this schedule around-the-clock for at least 3 days after surgery, or until you feel that it is no longer needed. Use the table on the last page of this handout to keep track of the medications you are taking. Important: Do not take more than 3000mg  of Tylenol or 180mg  of Motrin in a 24-hour period. Do not take ibuprofen/Motrin if you have a history of bleeding stomach ulcers, severe kidney disease, &/or actively taking a blood thinner  What if I still have pain? If you have pain that is not controlled with the over-the-counter pain medications (Tylenol and Motrin or Advil) you might have what we call "breakthrough" pain. You will receive a prescription for a small amount of an opioid pain  medication such as Oxycodone, Tramadol, or Tylenol with Codeine. Use these opioid pills in the first 24 hours after surgery if you have breakthrough pain. Do not take more than 1 pill every 4-6 hours.  If you still have uncontrolled pain after using all opioid pills, don't hesitate to call our staff using the number provided. We will help make sure you are managing your pain in the best way possible, and if necessary, we can provide a prescription for additional pain medication.   Day 1    Time  Name of Medication Number of pills taken  Amount of Acetaminophen  Pain Level   Comments  AM PM       AM PM       AM PM       AM PM       AM PM       AM PM       AM PM       AM PM       Total Daily amount of Acetaminophen Do not take more than  3,000 mg per day      Day 2    Time  Name of Medication Number of pills taken  Amount of Acetaminophen  Pain Level     Comments  AM PM       AM PM       AM PM       AM PM       AM PM       AM PM       AM PM       AM PM       Total Daily amount of Acetaminophen Do not take more than  3,000 mg per day      Day 3    Time  Name of Medication Number of pills taken  Amount of Acetaminophen  Pain Level   Comments  AM PM       AM PM       AM PM       AM PM          AM PM       AM PM       AM PM       AM PM       Total Daily amount of Acetaminophen Do not take more than  3,000 mg per day      Day 4    Time  Name of Medication Number of pills taken  Amount of Acetaminophen  Pain Level   Comments  AM PM       AM PM       AM PM       AM PM       AM PM       AM PM       AM PM       AM PM       Total Daily amount of Acetaminophen Do not take more than  3,000 mg per day      Day 5    Time  Name of Medication Number of pills taken  Amount of Acetaminophen  Pain Level   Comments  AM PM       AM PM       AM PM       AM PM       AM PM       AM PM  AM PM       AM PM       Total Daily amount of Acetaminophen Do not take more than  3,000 mg per day       Day 6    Time  Name of Medication Number of pills taken  Amount of Acetaminophen  Pain Level  Comments  AM PM       AM PM       AM PM       AM PM       AM PM       AM PM       AM PM       AM PM       Total Daily amount of Acetaminophen Do not take more than  3,000 mg per day      Day 7    Time  Name of Medication Number of pills taken  Amount of Acetaminophen  Pain Level   Comments  AM PM       AM PM       AM PM       AM PM       AM PM       AM PM       AM PM       AM PM       Total Daily amount of Acetaminophen Do not take more than  3,000 mg per day        For additional information about how and where to safely dispose of unused opioid medications - RoleLink.com.br  Disclaimer: This document contains information and/or instructional materials adapted from  [place] for the typical patient with your condition. It does not replace medical advice from your health care provider because your experience may differ from that of the typical patient. Talk to your health care provider if you have any questions about this document, your condition or your treatment plan. Adapted from [place]

## 2020-06-10 NOTE — Progress Notes (Signed)
Subjective: No abdominal pain.  Objective: Vital signs in last 24 hours: Temp:  [97.8 F (36.6 C)-98 F (36.7 C)] 97.8 F (36.6 C) (08/04 0754) Pulse Rate:  [79-92] 92 (08/04 0754) Resp:  [14-19] 19 (08/04 0754) BP: (132-148)/(82-106) 148/106 (08/04 0754) SpO2:  [95 %-100 %] 100 % (08/04 0754) Weight change:  Last BM Date: 06/08/20  PE: GEN:  NAD  Lab Results: CBC    Component Value Date/Time   WBC 7.3 06/10/2020 0442   RBC 3.99 06/10/2020 0442   HGB 12.6 06/10/2020 0442   HCT 37.0 06/10/2020 0442   PLT 231 06/10/2020 0442   MCV 92.7 06/10/2020 0442   MCH 31.6 06/10/2020 0442   MCHC 34.1 06/10/2020 0442   RDW 13.1 06/10/2020 0442   LYMPHSABS 2.6 06/10/2020 0442   MONOABS 0.5 06/10/2020 0442   EOSABS 0.2 06/10/2020 0442   BASOSABS 0.0 06/10/2020 0442   CMP     Component Value Date/Time   NA 136 06/10/2020 0442   K 3.1 (L) 06/10/2020 0442   CL 104 06/10/2020 0442   CO2 25 06/10/2020 0442   GLUCOSE 93 06/10/2020 0442   BUN <5 (L) 06/10/2020 0442   CREATININE 0.61 06/10/2020 0442   CALCIUM 8.8 (L) 06/10/2020 0442   PROT 6.9 06/10/2020 0442   ALBUMIN 3.2 (L) 06/10/2020 0442   AST 123 (H) 06/10/2020 0442   ALT 316 (H) 06/10/2020 0442   ALKPHOS 121 06/10/2020 0442   BILITOT 1.2 06/10/2020 0442   GFRNONAA >60 06/10/2020 0442   GFRAA >60 06/10/2020 0442   Assessment:  1.  Abdominal pain, migratory and generalized.  Resolved. 2.  Gallstones. 3.  Elevated LFTs.  Improving. 4.  Prominent CBD. MRCP negative for bile duct stones.  Plan:  1. Cholecystectomy today. 2.  [facility] will follow along at distance and, if intraoperative cholangiogram is negative, then will sign-off.   Landry Dyke 06/10/2020, 11:27 AM   Cell [phone] If no answer or after 5 PM call [phone]

## 2020-06-10 NOTE — Anesthesia Preprocedure Evaluation (Addendum)
Anesthesia Evaluation  Patient identified by MRN, date of birth, ID band Patient awake    Reviewed: Allergy & Precautions, H&P , NPO status , Patient's Chart, lab work & pertinent test results, reviewed documented beta blocker date and time   Airway Mallampati: II  TM Distance: >3 FB Neck ROM: Full    Dental no notable dental hx. (+) Teeth Intact, Dental Advisory Given   Pulmonary neg pulmonary ROS,    Pulmonary exam normal breath sounds clear to auscultation       Cardiovascular hypertension, Pt. on medications and Pt. on home beta blockers negative cardio ROS   Rhythm:Regular Rate:Normal     Neuro/Psych negative neurological ROS  negative psych ROS   GI/Hepatic negative GI ROS, Neg liver ROS,   Endo/Other  negative endocrine ROS  Renal/GU negative Renal ROS  negative genitourinary   Musculoskeletal   Abdominal   Peds  Hematology negative hematology ROS (+)   Anesthesia Other Findings   Reproductive/Obstetrics negative OB ROS                            Anesthesia Physical Anesthesia Plan  ASA: II  Anesthesia Plan: General   Post-op Pain Management:    Induction: Intravenous  PONV Risk Score and Plan: 4 or greater and Ondansetron, Dexamethasone and Midazolam  Airway Management Planned: Oral ETT  Additional Equipment:   Intra-op Plan:   Post-operative Plan: Extubation in OR  Informed Consent: I have reviewed the patients History and Physical, chart, labs and discussed the procedure including the risks, benefits and alternatives for the proposed anesthesia with the patient or authorized representative who has indicated his/her understanding and acceptance.     Dental advisory given  Plan Discussed with: CRNA  Anesthesia Plan Comments:         Anesthesia Quick Evaluation

## 2020-06-10 NOTE — Transfer of Care (Signed)
Immediate Anesthesia Transfer of Care Note  Patient: Carly Ramos  Procedure(s) Performed: LAPAROSCOPIC CHOLECYSTECTOMY WITH ATTEMPTED INTRAOPERATIVE CHOLANGIOGRAM (N/A Abdomen)  Patient Location: PACU  Anesthesia Type:General  Level of Consciousness: awake, alert  and oriented  Airway & Oxygen Therapy: Patient Spontanous Breathing and Patient connected to nasal cannula oxygen  Post-op Assessment: Report given to RN and Post -op Vital signs reviewed and stable  Post vital signs: Reviewed and stable  Last Vitals:  Vitals Value Taken Time  BP 138/91 06/10/20 1413  Temp 36.6 C 06/10/20 1410  Pulse 58 06/10/20 1416  Resp 20 06/10/20 1416  SpO2 96 % 06/10/20 1416  Vitals shown include unvalidated device data.  Last Pain:  Vitals:   06/10/20 1410  TempSrc:   PainSc: 0-No pain      Patients Stated Pain Goal: 0 (42/39/53 2023)  Complications: No complications documented.

## 2020-06-10 NOTE — Anesthesia Procedure Notes (Signed)
Procedure Name: Intubation Date/Time: 06/10/2020 12:37 PM Performed by: Valda Favia, CRNA Pre-anesthesia Checklist: Patient identified, Emergency Drugs available, Suction available and Patient being monitored Patient Re-evaluated:Patient Re-evaluated prior to induction Oxygen Delivery Method: Circle System Utilized Preoxygenation: Pre-oxygenation with 100% oxygen Induction Type: IV induction Ventilation: Mask ventilation without difficulty Laryngoscope Size: Mac and 4 Grade View: Grade I Tube type: Oral Tube size: 7.0 mm Number of attempts: 1 Airway Equipment and Method: Stylet and Oral airway Placement Confirmation: ETT inserted through vocal cords under direct vision,  positive ETCO2 and breath sounds checked- equal and bilateral Secured at: 20 cm Tube secured with: Tape Dental Injury: Teeth and Oropharynx as per pre-operative assessment

## 2020-06-11 ENCOUNTER — Encounter (HOSPITAL_COMMUNITY): Payer: Self-pay | Admitting: General Surgery

## 2020-06-11 DIAGNOSIS — K810 Acute cholecystitis: Secondary | ICD-10-CM

## 2020-06-11 LAB — COMPREHENSIVE METABOLIC PANEL
ALT: 256 U/L — ABNORMAL HIGH (ref 0–44)
AST: 76 U/L — ABNORMAL HIGH (ref 15–41)
Albumin: 3.4 g/dL — ABNORMAL LOW (ref 3.5–5.0)
Alkaline Phosphatase: 132 U/L — ABNORMAL HIGH (ref 38–126)
Anion gap: 10 (ref 5–15)
BUN: 6 mg/dL (ref 6–20)
CO2: 23 mmol/L (ref 22–32)
Calcium: 9.4 mg/dL (ref 8.9–10.3)
Chloride: 105 mmol/L (ref 98–111)
Creatinine, Ser: 0.94 mg/dL (ref 0.44–1.00)
GFR calc Af Amer: >60 mL/min (ref >60)
GFR calc non Af Amer: >60 mL/min (ref >60)
Glucose, Bld: 115 mg/dL — ABNORMAL HIGH (ref 70–99)
Potassium: 3.8 mmol/L (ref 3.5–5.1)
Sodium: 138 mmol/L (ref 135–145)
Total Bilirubin: 0.8 mg/dL (ref 0.3–1.2)
Total Protein: 7.3 g/dL (ref 6.5–8.1)

## 2020-06-11 LAB — CBC WITH DIFFERENTIAL/PLATELET
Abs Immature Granulocytes: 0.04 10*3/uL (ref 0.00–0.07)
Basophils Absolute: 0 10*3/uL (ref 0.0–0.1)
Basophils Relative: 0 %
Eosinophils Absolute: 0 10*3/uL (ref 0.0–0.5)
Eosinophils Relative: 0 %
HCT: 40.2 % (ref 36.0–46.0)
Hemoglobin: 13.5 g/dL (ref 12.0–15.0)
Immature Granulocytes: 0 %
Lymphocytes Relative: 12 %
Lymphs Abs: 1.5 10*3/uL (ref 0.7–4.0)
MCH: 31 pg (ref 26.0–34.0)
MCHC: 33.6 g/dL (ref 30.0–36.0)
MCV: 92.4 fL (ref 80.0–100.0)
Monocytes Absolute: 0.6 10*3/uL (ref 0.1–1.0)
Monocytes Relative: 5 %
Neutro Abs: 10 10*3/uL — ABNORMAL HIGH (ref 1.7–7.7)
Neutrophils Relative %: 83 %
Platelets: 273 10*3/uL (ref 150–400)
RBC: 4.35 MIL/uL (ref 3.87–5.11)
RDW: 12.9 % (ref 11.5–15.5)
WBC: 12.1 10*3/uL — ABNORMAL HIGH (ref 4.0–10.5)
nRBC: 0 % (ref 0.0–0.2)

## 2020-06-11 LAB — MAGNESIUM: Magnesium: 2.1 mg/dL (ref 1.7–2.4)

## 2020-06-11 LAB — SURGICAL PATHOLOGY

## 2020-06-11 MED ORDER — OXYCODONE HCL 5 MG PO TABS: 5.0000 mg | ORAL_TABLET | Freq: Four times a day (QID) | ORAL | 0 refills | Status: DC | PRN

## 2020-06-11 MED ORDER — ACETAMINOPHEN 325 MG PO TABS: 650.0000 mg | ORAL_TABLET | Freq: Four times a day (QID) | ORAL | Status: AC

## 2020-06-11 MED ORDER — IBUPROFEN 200 MG PO TABS
400.0000 mg | ORAL_TABLET | Freq: Three times a day (TID) | ORAL | Status: DC | PRN
Start: 2020-06-11 — End: 2022-10-11

## 2020-06-11 NOTE — Discharge Summary (Signed)
Physician Discharge Summary  Carly Ramos OEU:[id number] DOB: 01-20-1986 DOA: 06/08/2020  PCP: Patient, No Pcp Per  Admit date: 06/08/2020 Discharge date: 06/11/2020  Admitted From: Home  Disposition:  Home   Recommendations for Outpatient Follow-up:  1. Follow up with General Surgery as directed      Home Health: None  Equipment/Devices: None  Discharge Condition: Good  CODE STATUS: FULL Diet recommendation: Soft  Brief/Interim Summary: Carly Ramos is a 34 y.o. F with obesity, HTN, and and pAF not on [facility] who presented with 1 day vomiting after eating with epigastric pain.  In the ER, LFTs low 100s.  Admitted for transaminitis and vomiting.        PRINCIPAL HOSPITAL DIAGNOSIS: Acute calculous cholecystitis    Discharge Diagnoses:   Acute calculous cholecystitis S/p laparoscopic cholecystectomy on 8/4 by Dr. Redmond Pulling Patient admitted. GI consulted. MRCP showed dilated CBD without stone, inflamed gallbladder. Gen Surg consulted, recommended cholecystectomy, performed laparoscopically on 8/4 without complication.      Paroxysmal atrial fibrillation Seen in ER 03/02/19 for palpitations, found to be in Afib with RVR, underwent cardioversion and started on Eliquis.  At Cardiology follow up that year with [facility], holter monitoring showed no further Afib.  Given CHA2DS2-Vasc 2 in female, decision made to stop Penton.   -Continue aspirin and statin  Hypertension       Discharge Instructions   Allergies as of 06/11/2020      Reactions   Penicillins Swelling   Did it involve swelling of the face/tongue/throat, SOB, or low BP? Yes Did it involve sudden or severe rash/hives, skin peeling, or any reaction on the inside of your mouth or nose? No Did you need to seek medical attention at a hospital or doctor's office? Yes When did it last happen?childhood If all above answers are "NO", may proceed with cephalosporin use.      Medication List    STOP taking these  medications   apixaban 5 MG Tabs tablet Commonly known as: ELIQUIS   aspirin-acetaminophen-caffeine 250-250-65 MG tablet Commonly known as: EXCEDRIN MIGRAINE   naproxen 500 MG tablet Commonly known as: NAPROSYN   potassium chloride SA 20 MEQ tablet Commonly known as: KLOR-CON     TAKE these medications   acetaminophen 325 MG tablet Commonly known as: TYLENOL Take 2 tablets (650 mg total) by mouth every 6 (six) hours for 4 days.   amLODipine 10 MG tablet Commonly known as: NORVASC Take 10 mg by mouth daily as needed (blood pressure).   aspirin 81 MG EC tablet Take 1 tablet by mouth daily.   atenolol 25 MG tablet Commonly known as: TENORMIN Take 25 mg by mouth daily.   fluticasone 50 MCG/ACT nasal spray Commonly known as: FLONASE Place 1 spray into both nostrils daily.   HYDROCORTISONE EX Apply 1 application topically as needed (hives).   ibuprofen 200 MG tablet Commonly known as: ADVIL Take 2 tablets (400 mg total) by mouth every 8 (eight) hours as needed for mild pain or moderate pain.   MULTIVITAMIN ADULT PO Take 1 tablet by mouth daily. gummies   oxyCODONE 5 MG immediate release tablet Commonly known as: Oxy IR/ROXICODONE Take 1 tablet (5 mg total) by mouth every 6 (six) hours as needed for severe pain (not releived by tylenol or ibuprofen).       Follow-up [facility] Follow up.   Specialty: General Surgery Why: our office is scheduling you for post-operative follow up. Please call to confirm appointment  date/time.  Contact information: [place] [phone]             Allergies  Allergen Reactions  . Penicillins Swelling    Did it involve swelling of the face/tongue/throat, SOB, or low BP? Yes Did it involve sudden or severe rash/hives, skin peeling, or any reaction on the inside of your mouth or nose? No Did you need to seek medical attention at a  hospital or doctor's office? Yes When did it last happen?childhood If all above answers are "NO", may proceed with cephalosporin use.     Consultations:  General Surgery GI   Procedures/Studies: CT ABDOMEN PELVIS W CONTRAST  Result Date: 06/08/2020 CLINICAL DATA:  34 year old female with abdominal pain. EXAM: CT ABDOMEN AND PELVIS WITH CONTRAST TECHNIQUE: Multidetector CT imaging of the abdomen and pelvis was performed using the standard protocol following bolus administration of intravenous contrast. CONTRAST:  18mL OMNIPAQUE IOHEXOL 300 MG/ML  SOLN COMPARISON:  Abdominal ultrasound dated 06/08/2020 FINDINGS: Lower chest: The visualized lung bases are clear. No intra-abdominal free air or free fluid. Hepatobiliary: There is a 15 mm hypodense lesion in the dome of the liver (10/3) which is suboptimally characterized. The liver is otherwise unremarkable. No intrahepatic biliary ductal dilatation. There is gallstone. There is mild diffuse thickening of the gallbladder wall. Repeat ultrasound may provide better evaluation of the gallbladder if there is continued clinical concern for acute cholecystitis. Pancreas: Unremarkable. No pancreatic ductal dilatation or surrounding inflammatory changes. Spleen: Normal in size without focal abnormality. Adrenals/Urinary Tract: The adrenal glands are unremarkable. The kidneys, visualized ureters, and urinary bladder appear unremarkable. Stomach/Bowel: There is moderate stool throughout the colon. There is no bowel obstruction or active inflammation. The appendix is normal. Vascular/Lymphatic: The abdominal aorta and IVC unremarkable. No portal venous gas. There is no adenopathy. Reproductive: The uterus is enlarged and myomatous. There is a 3.5 cm intracavitary or submucosal fibroid. Ultrasound or MRI may provide better evaluation of the pelvis and uterine fibroids. The ovaries are grossly unremarkable. Other: None Musculoskeletal: No acute or significant osseous  findings. IMPRESSION: 1. Cholelithiasis with mild diffuse thickening of the gallbladder wall. Repeat ultrasound may provide better evaluation if there is continued clinical concern for acute cholecystitis. 2. No bowel obstruction. Normal appendix. 3. Enlarged myomatous uterus. Electronically Signed   By: Anner Crete M.D.   On: 06/08/2020 20:54   MR ABDOMEN MRCP W WO CONTAST  Result Date: 06/09/2020 CLINICAL DATA:  Abdominal pain.  Cholelithiasis. EXAM: MRI ABDOMEN WITHOUT AND WITH CONTRAST (INCLUDING MRCP) TECHNIQUE: Multiplanar multisequence MR imaging of the abdomen was performed both before and after the administration of intravenous contrast. Heavily T2-weighted images of the biliary and pancreatic ducts were obtained, and three-dimensional MRCP images were rendered by post processing. CONTRAST:  74mL GADAVIST GADOBUTROL 1 MMOL/ML IV SOLN COMPARISON:  CT scan 06/08/2020 and ultrasound examination 06/08/2020 FINDINGS: Lower chest: The lung bases are grossly clear. No pleural or pericardial effusion. Hepatobiliary: Simple nonenhancing 14 mm cyst is noted in the left hepatic dome region. No worrisome hepatic lesions or intrahepatic biliary dilatation. Numerous small gallstones are noted the gallbladder. There is also mild gallbladder wall thickening and gallbladder wall edema. Moderate associated mucosal enhancement is noted. Findings suspicious for acute cholecystitis. Normal caliber and course of the common bile duct. No common bile duct stones. Pancreas:  No mass, inflammation or ductal dilatation. Spleen:  Normal size.  No focal lesions. Adrenals/Urinary Tract: The adrenal glands and kidneys are unremarkable.  No worrisome renal lesions or hydronephrosis. Stomach/Bowel: The stomach, duodenum, visualized small bowel and visualize colon are unremarkable. Vascular/Lymphatic: The aorta and branch vessels are patent. The major venous structures are patent. No mesenteric or retroperitoneal mass or adenopathy.  Other:  No ascites or abdominal wall hernia. Musculoskeletal: No significant bony findings. IMPRESSION: 1. Cholelithiasis and MRI findings suspicious for acute cholecystitis. Nuclear medicine hepatobiliary scan may be helpful to document cystic duct obstruction. 2. Normal caliber and course of the common bile duct. No common bile duct stones. 3. No acute abdominal findings otherwise and no mass lesions or adenopathy. Electronically Signed   By: Marijo Sanes M.D.   On: 06/09/2020 17:25   US Abdomen Limited RUQ  Result Date: 06/08/2020 CLINICAL DATA:  34 year old female with right upper quadrant pain, nausea and vomiting for 1 day. EXAM: ULTRASOUND ABDOMEN LIMITED RIGHT UPPER QUADRANT COMPARISON:  None. FINDINGS: Gallbladder: Shadowing echogenic gallstones, individually up to 15 mm diameter (image 8). Gallbladder wall thickness remains normal. No pericholecystic fluid. No sonographic Murphy sign elicited. Common bile duct: Diameter: 7 mm, mildly increased (image 25). No ductal filling defect identified. Liver: No focal lesion identified. Within normal limits in parenchymal echogenicity. Portal vein is patent on color Doppler imaging with normal direction of blood flow towards the liver. No intrahepatic biliary ductal dilatation. Other: No free fluid. IMPRESSION: Positive for Cholelithiasis. No secondary findings of acute cholecystitis, although the CBD is mildly enlarged (7 mm) such that choledocholithiasis is difficult to exclude. Electronically Signed   By: Genevie Ann M.D.   On: 06/08/2020 12:48      Subjective: Patient feeling sleepy after taking oxycodone, but expected postoperative bloating and discomfort in the right upper quadrant, no fever, confusion, vomiting.  Able to tolerate p.o., ambulating without assistance.  Discharge Exam: Vitals:   06/11/20 0200 06/11/20 0755  BP: 119/78 (!) 139/96  Pulse: 70 76  Resp: 15 14  Temp: 98.9 F (37.2 C) 98.2 F (36.8 C)  SpO2:  99%   Vitals:    06/10/20 1652 06/10/20 2313 06/11/20 0200 06/11/20 0755  BP: 122/76  119/78 (!) 139/96  Pulse: 67 72 70 76  Resp: 20 14 15 14   Temp: 98.3 F (36.8 C)  98.9 F (37.2 C) 98.2 F (36.8 C)  TempSrc:   Oral Oral  SpO2: 98% 96%  99%  Weight:      Height:        General: Pt is alert, awake, not in acute distress Cardiovascular: RRR, nl S1-S2, no murmurs appreciated.   No LE edema.   Respiratory: Normal respiratory rate and rhythm.  CTAB without rales or wheezes. Abdominal: Abdomen soft and mildly tender in right upper quadrant.  No distension or HSM.   Neuro/Psych: Strength symmetric in upper and lower extremities.  Judgment and insight appear normal.   The results of significant diagnostics from this hospitalization (including imaging, microbiology, ancillary and laboratory) are listed below for reference.     Microbiology: Recent Results (from the past 240 hour(s))  SARS Coronavirus 2 by RT PCR (hospital order, performed in [facility] hospital lab) Nasopharyngeal Nasopharyngeal Swab     Status: None   Collection Time: 06/08/20  9:36 PM   Specimen: Nasopharyngeal Swab  Result Value Ref Range Status   SARS Coronavirus 2 NEGATIVE NEGATIVE Final    Comment: (NOTE) SARS-CoV-2 target nucleic acids are NOT DETECTED.  The SARS-CoV-2 RNA is generally detectable in upper and lower respiratory specimens during the acute phase of infection. The lowest concentration  of SARS-CoV-2 viral copies this assay can detect is 250 copies / mL. A negative result does not preclude SARS-CoV-2 infection and should not be used as the sole basis for treatment or other patient management decisions.  A negative result may occur with improper specimen collection / handling, submission of specimen other than nasopharyngeal swab, presence of viral mutation(s) within the areas targeted by this assay, and inadequate number of viral copies (<250 copies / mL). A negative result must be combined with  clinical observations, patient history, and epidemiological information.  Fact Sheet for Patients:   StrictlyIdeas.no  Fact Sheet for Healthcare Providers: BankingDealers.co.za  This test is not yet approved or  cleared by the [place] FDA and has been authorized for detection and/or diagnosis of SARS-CoV-2 by FDA under an Emergency Use Authorization (EUA).  This EUA will remain in effect (meaning this test can be used) for the duration of the COVID-19 declaration under Section 564(b)(1) of the Act, 21 U.S.C. section 360bbb-3(b)(1), unless the authorization is terminated or revoked sooner.  Performed at [facility], [place] [address], [place], [place] [address]   Surgical PCR screen     Status: Abnormal   Collection Time: 06/09/20  8:32 PM   Specimen: Nasal Mucosa; Nasal Swab  Result Value Ref Range Status   MRSA, PCR NEGATIVE NEGATIVE Final   Staphylococcus aureus POSITIVE (A) NEGATIVE Final    Comment: (NOTE) The Xpert SA Assay (FDA approved for NASAL specimens in patients 72 years of age and older), is one component of a comprehensive surveillance program. It is not intended to diagnose infection nor to guide or monitor treatment. Performed at [facility], [place] [address] [place], [place] [address]      Labs: BNP (last 3 results) No results for input(s): BNP in the last 8760 hours. Basic Metabolic Panel: Recent Labs  Lab 06/08/20 1048 06/09/20 0609 06/10/20 0442 06/11/20 0126  NA 138 137 136 138  K 2.8* 2.9* 3.1* 3.8  CL 102 105 104 105  CO2 24 25 25 23   GLUCOSE 133* 112* 93 115*  BUN 7 <5* <5* 6  CREATININE 0.68 0.64 0.61 0.94  CALCIUM 9.1 8.5* 8.8* 9.4  MG  --  1.9 1.6* 2.1  PHOS  --  2.9 2.6  --    Liver Function Tests: Recent Labs  Lab 06/08/20 1048 06/09/20 0609 06/10/20 0442 06/11/20 0126  AST 671* 330* 123* 76*  ALT 385* 445* 316* 256*  ALKPHOS 119 126 121 132*  BILITOT 1.9* 3.4*  1.2 0.8  PROT 7.1 6.5 6.9 7.3  ALBUMIN 3.7 3.2* 3.2* 3.4*   Recent Labs  Lab 06/08/20 1048  LIPASE 37   Recent Labs  Lab 06/08/20 2222  AMMONIA 35   CBC: Recent Labs  Lab 06/08/20 1048 06/09/20 0609 06/10/20 0442 06/11/20 0126  WBC 8.0 6.8 7.3 12.1*  NEUTROABS  --  4.0 4.0 10.0*  HGB 13.4 11.9* 12.6 13.5  HCT 38.8 35.0* 37.0 40.2  MCV 93.0 93.1 92.7 92.4  PLT 259 226 231 273   Cardiac Enzymes: Recent Labs  Lab 06/10/20 0442  CKTOTAL 126   BNP: Invalid input(s): POCBNP CBG: No results for input(s): GLUCAP in the last 168 hours. D-Dimer No results for input(s): DDIMER in the last 72 hours. Hgb A1c No results for input(s): HGBA1C in the last 72 hours. Lipid Profile Recent Labs    06/09/20 0609  CHOL 109  HDL 42  LDLCALC 58  TRIG 46  CHOLHDL 2.6   Thyroid function studies Recent Labs    06/09/20 0609  TSH 2.575   Anemia work up No results for input(s): VITAMINB12, FOLATE, FERRITIN, TIBC, IRON, RETICCTPCT in the last 72 hours. Urinalysis    Component Value Date/Time   COLORURINE YELLOW 06/08/2020 1730   APPEARANCEUR HAZY (A) 06/08/2020 1730   LABSPEC 1.002 (L) 06/08/2020 1730   PHURINE 7.0 06/08/2020 1730   GLUCOSEU NEGATIVE 06/08/2020 1730   HGBUR SMALL (A) 06/08/2020 1730   BILIRUBINUR NEGATIVE 06/08/2020 1730   KETONESUR NEGATIVE 06/08/2020 1730   PROTEINUR NEGATIVE 06/08/2020 1730   NITRITE NEGATIVE 06/08/2020 1730   LEUKOCYTESUR NEGATIVE 06/08/2020 1730   Sepsis Labs Invalid input(s): PROCALCITONIN,  WBC,  LACTICIDVEN Microbiology Recent Results (from the past 240 hour(s))  SARS Coronavirus 2 by RT PCR (hospital order, performed in [facility] hospital lab) Nasopharyngeal Nasopharyngeal Swab     Status: None   Collection Time: 06/08/20  9:36 PM   Specimen: Nasopharyngeal Swab  Result Value Ref Range Status   SARS Coronavirus 2 NEGATIVE NEGATIVE Final    Comment: (NOTE) SARS-CoV-2 target nucleic acids are NOT DETECTED.  The  SARS-CoV-2 RNA is generally detectable in upper and lower respiratory specimens during the acute phase of infection. The lowest concentration of SARS-CoV-2 viral copies this assay can detect is 250 copies / mL. A negative result does not preclude SARS-CoV-2 infection and should not be used as the sole basis for treatment or other patient management decisions.  A negative result may occur with improper specimen collection / handling, submission of specimen other than nasopharyngeal swab, presence of viral mutation(s) within the areas targeted by this assay, and inadequate number of viral copies (<250 copies / mL). A negative result must be combined with clinical observations, patient history, and epidemiological information.  Fact Sheet for Patients:   StrictlyIdeas.no  Fact Sheet for Healthcare Providers: BankingDealers.co.za  This test is not yet approved or  cleared by the [place] FDA and has been authorized for detection and/or diagnosis of SARS-CoV-2 by FDA under an Emergency Use Authorization (EUA).  This EUA will remain in effect (meaning this test can be used) for the duration of the COVID-19 declaration under Section 564(b)(1) of the Act, 21 U.S.C. section 360bbb-3(b)(1), unless the authorization is terminated or revoked sooner.  Performed at [facility], [place] [address] [place], [place] [address]   Surgical PCR screen     Status: Abnormal   Collection Time: 06/09/20  8:32 PM   Specimen: Nasal Mucosa; Nasal Swab  Result Value Ref Range Status   MRSA, PCR NEGATIVE NEGATIVE Final   Staphylococcus aureus POSITIVE (A) NEGATIVE Final    Comment: (NOTE) The Xpert SA Assay (FDA approved for NASAL specimens in patients 92 years of age and older), is one component of a comprehensive surveillance program. It is not intended to diagnose infection nor to guide or monitor treatment. Performed at [facility],  [place] [address] [place], [place] [address]      Time coordinating discharge: 25 minutes [place] controlled substances registry was reviewed for this patient prior to filling the 7 days supply controlled substances script for postoperative pain.      SIGNED:   Edwin Dada, MD  [facility] 06/11/2020, 5:40 PM

## 2020-06-11 NOTE — Progress Notes (Signed)
[facility] Progress Note  1 Day Post-Op  Subjective: CC:  NAEO. Reports abdominal soreness and distention. Tolerated soup, fruit, and crackers for dinner. Voiding without sxs.  Objective: Vital signs in last 24 hours: Temp:  [97.9 F (36.6 C)-98.9 F (37.2 C)] 98.9 F (37.2 C) (08/05 0200) Pulse Rate:  [51-72] 70 (08/05 0200) Resp:  [14-20] 15 (08/05 0200) BP: (119-147)/(76-91) 119/78 (08/05 0200) SpO2:  [95 %-99 %] 96 % (08/04 2313) Last BM Date: 06/08/20  Intake/Output from previous day: 08/04 0701 - 08/05 0700 In: 1000 [I.V.:800; IV Piggyback:200] Out: 120 [Urine:100; Blood:20] Intake/Output this shift: No intake/output data recorded.  PE: Gen:  Alert, NAD, pleasant Card:  Regular rate and rhythm, pedal pulses 2+ BL Pulm:  Normal effort, clear to auscultation bilaterally Abd: Soft, appropriately tender, mild distention, bowel sounds present, incisions C/D/I - umbilical incision with small amt SS drainage dried on gauze. Skin: warm and dry, no rashes  Psych: A&Ox3   Lab Results:  Recent Labs    06/10/20 0442 06/11/20 0126  WBC 7.3 12.1*  HGB 12.6 13.5  HCT 37.0 40.2  PLT 231 273   BMET Recent Labs    06/10/20 0442 06/11/20 0126  NA 136 138  K 3.1* 3.8  CL 104 105  CO2 25 23  GLUCOSE 93 115*  BUN <5* 6  CREATININE 0.61 0.94  CALCIUM 8.8* 9.4   PT/INR Recent Labs    06/08/20 2000  LABPROT 13.9  INR 1.1   CMP     Component Value Date/Time   NA 138 06/11/2020 0126   K 3.8 06/11/2020 0126   CL 105 06/11/2020 0126   CO2 23 06/11/2020 0126   GLUCOSE 115 (H) 06/11/2020 0126   BUN 6 06/11/2020 0126   CREATININE 0.94 06/11/2020 0126   CALCIUM 9.4 06/11/2020 0126   PROT 7.3 06/11/2020 0126   ALBUMIN 3.4 (L) 06/11/2020 0126   AST 76 (H) 06/11/2020 0126   ALT 256 (H) 06/11/2020 0126   ALKPHOS 132 (H) 06/11/2020 0126   BILITOT 0.8 06/11/2020 0126   GFRNONAA >60 06/11/2020 0126   GFRAA >60 06/11/2020 0126   Lipase     Component  Value Date/Time   LIPASE 37 06/08/2020 1048       Studies/Results: MR ABDOMEN MRCP W WO CONTAST  Result Date: 06/09/2020 CLINICAL DATA:  Abdominal pain.  Cholelithiasis. EXAM: MRI ABDOMEN WITHOUT AND WITH CONTRAST (INCLUDING MRCP) TECHNIQUE: Multiplanar multisequence MR imaging of the abdomen was performed both before and after the administration of intravenous contrast. Heavily T2-weighted images of the biliary and pancreatic ducts were obtained, and three-dimensional MRCP images were rendered by post processing. CONTRAST:  67mL GADAVIST GADOBUTROL 1 MMOL/ML IV SOLN COMPARISON:  CT scan 06/08/2020 and ultrasound examination 06/08/2020 FINDINGS: Lower chest: The lung bases are grossly clear. No pleural or pericardial effusion. Hepatobiliary: Simple nonenhancing 14 mm cyst is noted in the left hepatic dome region. No worrisome hepatic lesions or intrahepatic biliary dilatation. Numerous small gallstones are noted the gallbladder. There is also mild gallbladder wall thickening and gallbladder wall edema. Moderate associated mucosal enhancement is noted. Findings suspicious for acute cholecystitis. Normal caliber and course of the common bile duct. No common bile duct stones. Pancreas:  No mass, inflammation or ductal dilatation. Spleen:  Normal size.  No focal lesions. Adrenals/Urinary Tract: The adrenal glands and kidneys are unremarkable. No worrisome renal lesions or hydronephrosis. Stomach/Bowel: The stomach, duodenum, visualized small bowel and visualize colon are unremarkable. Vascular/Lymphatic: The aorta and branch  vessels are patent. The major venous structures are patent. No mesenteric or retroperitoneal mass or adenopathy. Other:  No ascites or abdominal wall hernia. Musculoskeletal: No significant bony findings. IMPRESSION: 1. Cholelithiasis and MRI findings suspicious for acute cholecystitis. Nuclear medicine hepatobiliary scan may be helpful to document cystic duct obstruction. 2. Normal caliber  and course of the common bile duct. No common bile duct stones. 3. No acute abdominal findings otherwise and no mass lesions or adenopathy. Electronically Signed   By: Marijo Sanes M.D.   On: 06/09/2020 17:25    Anti-infectives: Anti-infectives (From admission, onward)   Start     Dose/Rate Route Frequency Ordered Stop   06/10/20 1100  ciprofloxacin (CIPRO) IVPB 400 mg        400 mg 200 mL/hr over 60 Minutes Intravenous On call to O.R. 06/10/20 0819 06/10/20 1338     Assessment/Plan POD#1 laparoscopic cholecystectomy (06/10/20 Dr. Redmond Pulling) for symptomatic cholelithiasis with acute cholecystitis.  - tolerating PO - pain controlled on PO meds - voiding spontaneously without sxs - needs to mobilize in the hallway with RN or nurse tech prior to discharge. If she mobilizes safely then she may be discharged home today from a surgical perspective. Follow up and narcotic Rx provided. Post-operative activity, bathing, wound care, return to work all discussed and the patient voiced understanding.    LOS: 2 days    Obie Dredge, [facility] Please see Amion for pager number during day hours 7:00am-4:30pm

## 2020-06-16 ENCOUNTER — Emergency Department (HOSPITAL_COMMUNITY)
Admission: EM | Admit: 2020-06-16 | Discharge: 2020-06-16 | Disposition: A | Discharge status: Home / Self Care | Payer: Medicaid - Out of State | Attending: Emergency Medicine | Admitting: Emergency Medicine

## 2020-06-16 ENCOUNTER — Emergency Department (HOSPITAL_COMMUNITY)
Admission: EM | Admit: 2020-06-16 | Discharge: 2020-06-17 | Discharge status: Against Medical Advice | Payer: Medicaid - Out of State

## 2020-06-16 ENCOUNTER — Other Ambulatory Visit: Payer: Self-pay

## 2020-06-16 ENCOUNTER — Encounter (HOSPITAL_COMMUNITY): Payer: Self-pay

## 2020-06-16 DIAGNOSIS — R112 Nausea with vomiting, unspecified: Secondary | ICD-10-CM | POA: Diagnosis not present

## 2020-06-16 DIAGNOSIS — Z5321 Procedure and treatment not carried out due to patient leaving prior to being seen by health care provider: Secondary | ICD-10-CM | POA: Insufficient documentation

## 2020-06-16 DIAGNOSIS — R109 Unspecified abdominal pain: Secondary | ICD-10-CM | POA: Diagnosis not present

## 2020-06-16 LAB — COMPREHENSIVE METABOLIC PANEL
ALT: 732 U/L — ABNORMAL HIGH (ref 0–44)
AST: 659 U/L — ABNORMAL HIGH (ref 15–41)
Albumin: 4 g/dL (ref 3.5–5.0)
Alkaline Phosphatase: 263 U/L — ABNORMAL HIGH (ref 38–126)
Anion gap: 8 (ref 5–15)
BUN: 6 mg/dL (ref 6–20)
CO2: 27 mmol/L (ref 22–32)
Calcium: 9.8 mg/dL (ref 8.9–10.3)
Chloride: 108 mmol/L (ref 98–111)
Creatinine, Ser: 0.71 mg/dL (ref 0.44–1.00)
GFR calc Af Amer: >60 mL/min (ref >60)
GFR calc non Af Amer: >60 mL/min (ref >60)
Glucose, Bld: 147 mg/dL — ABNORMAL HIGH (ref 70–99)
Potassium: 3.7 mmol/L (ref 3.5–5.1)
Sodium: 143 mmol/L (ref 135–145)
Total Bilirubin: 3.5 mg/dL — ABNORMAL HIGH (ref 0.3–1.2)
Total Protein: 7.8 g/dL (ref 6.5–8.1)

## 2020-06-16 LAB — URINALYSIS, ROUTINE W REFLEX MICROSCOPIC
Bilirubin Urine: NEGATIVE
Glucose, UA: NEGATIVE mg/dL
Hgb urine dipstick: NEGATIVE
Ketones, ur: NEGATIVE mg/dL
Leukocytes,Ua: NEGATIVE
Nitrite: NEGATIVE
Protein, ur: NEGATIVE mg/dL
Specific Gravity, Urine: 1.005 (ref 1.005–1.030)
pH: 8 (ref 5.0–8.0)

## 2020-06-16 LAB — LIPASE, BLOOD: Lipase: 3697 U/L — ABNORMAL HIGH (ref 11–51)

## 2020-06-16 LAB — CBC
HCT: 42.1 % (ref 36.0–46.0)
Hemoglobin: 14.1 g/dL (ref 12.0–15.0)
MCH: 32.1 pg (ref 26.0–34.0)
MCHC: 33.5 g/dL (ref 30.0–36.0)
MCV: 95.9 fL (ref 80.0–100.0)
Platelets: 282 10*3/uL (ref 150–400)
RBC: 4.39 MIL/uL (ref 3.87–5.11)
RDW: 13.4 % (ref 11.5–15.5)
WBC: 10.8 10*3/uL — ABNORMAL HIGH (ref 4.0–10.5)
nRBC: 0 % (ref 0.0–0.2)

## 2020-06-16 LAB — I-STAT BETA HCG BLOOD, ED (MC, WL, AP ONLY): I-stat hCG, quantitative: <5 m[IU]/mL (ref <5)

## 2020-06-16 MED ORDER — ONDANSETRON 4 MG PO TBDP
4.0000 mg | ORAL_TABLET | Freq: Once | ORAL | Status: AC | PRN
  Administered 2020-06-16: 4 mg via ORAL
  Filled 2020-06-16: qty 1

## 2020-06-16 NOTE — ED Triage Notes (Signed)
Patient states on Thursday she had her Gallbladder removed. Starting yesterday she began having abdominal pain and NV. Advised to go back onto a liquid diet but states symptoms did not change and advised to come to ED.

## 2020-09-16 ENCOUNTER — Other Ambulatory Visit: Payer: Self-pay

## 2020-09-16 ENCOUNTER — Encounter (HOSPITAL_COMMUNITY): Payer: Self-pay

## 2020-09-16 ENCOUNTER — Emergency Department (HOSPITAL_COMMUNITY)
Admission: EM | Admit: 2020-09-16 | Discharge: 2020-09-17 | Disposition: A | Discharge status: Home / Self Care | Payer: Medicaid - Out of State | Attending: Emergency Medicine | Admitting: Emergency Medicine

## 2020-09-16 DIAGNOSIS — K0889 Other specified disorders of teeth and supporting structures: Secondary | ICD-10-CM | POA: Diagnosis not present

## 2020-09-16 DIAGNOSIS — Z7982 Long term (current) use of aspirin: Secondary | ICD-10-CM | POA: Diagnosis not present

## 2020-09-16 DIAGNOSIS — I10 Essential (primary) hypertension: Secondary | ICD-10-CM | POA: Diagnosis not present

## 2020-09-16 DIAGNOSIS — Z79899 Other long term (current) drug therapy: Secondary | ICD-10-CM | POA: Diagnosis not present

## 2020-09-16 NOTE — ED Triage Notes (Signed)
Pt reports pain in of the lower L side molar for the past 2 weeks

## 2020-09-17 MED ORDER — CLINDAMYCIN HCL 150 MG PO CAPS
300.0000 mg | ORAL_CAPSULE | Freq: Once | ORAL | Status: DC
  Filled 2020-09-17: qty 2

## 2020-09-17 MED ORDER — CLINDAMYCIN HCL 150 MG PO CAPS
300.0000 mg | ORAL_CAPSULE | Freq: Three times a day (TID) | ORAL | 0 refills | Status: DC
Start: 2020-09-17 — End: 2022-10-11

## 2020-09-17 NOTE — ED Notes (Signed)
DC instructions reviewed with patient and patient verbalized understanding. Patient BP was elevated and provider stated to have her take BP meds at home. Patient ambulatory on discharge.

## 2020-09-17 NOTE — ED Provider Notes (Signed)
[facility] EMERGENCY DEPARTMENT Provider Note   CSN: [id number] Arrival date & time: 09/16/20  2242     History   Chief Complaint Chief Complaint  Patient presents with  . Dental Pain    HPI Carly Ramos is a 34 y.o. female.  Patient presents to the emergency department with a dental complaint. Symptoms began 2 weeks ago. The patient has tried to alleviate pain with OTC meds.  Pain rated as moderate to severe, characterized as throbbing in nature and located left lower molar. Patient denies fever, night sweats, chills, difficulty swallowing or opening mouth, SOB, nuchal rigidity or decreased ROM of neck.  Patient does not have a dentist and requests a resource guide at discharge.      HPI  Past Medical History:  Diagnosis Date  . Hypertension     Patient Active Problem List   Diagnosis Date Noted  . Cholelithiasis 06/08/2020  . Essential hypertension 06/08/2020  . Elevated LFTs 06/08/2020  . Dehydration 06/08/2020  . Hypokalemia 06/08/2020  . AF (paroxysmal atrial fibrillation) ([place]) 06/08/2020    Past Surgical History:  Procedure Laterality Date  . CHOLECYSTECTOMY N/A 06/10/2020   Procedure: LAPAROSCOPIC CHOLECYSTECTOMY WITH ATTEMPTED INTRAOPERATIVE CHOLANGIOGRAM;  Surgeon: Greer Pickerel, MD;  Location: [facility];  Service: General;  Laterality: N/A;     OB History   No obstetric history on file.      Home Medications    Prior to Admission medications   Medication Sig Start Date End Date Taking? Authorizing Provider  acetaminophen (TYLENOL) 500 MG tablet Take 500 mg by mouth every 6 (six) hours as needed for mild pain or moderate pain.   Yes [provider]  amLODipine (NORVASC) 10 MG tablet Take 10 mg by mouth daily as needed (blood pressure).  11/05/18  Yes [provider]  aspirin 81 MG EC tablet Take 1 tablet by mouth daily. 03/06/20  Yes [provider]  atenolol (TENORMIN) 25 MG tablet Take 25 mg by mouth daily.  03/29/20  Yes [provider]  ibuprofen (ADVIL) 200 MG tablet Take 2 tablets (400 mg total) by mouth every 8 (eight) hours as needed for mild pain or moderate pain. 06/11/20  Yes Simaan, Darci Current, PA-C  Multiple Vitamins-Minerals (MULTIVITAMIN ADULT PO) Take 1 tablet by mouth daily. gummies   Yes [provider]  clindamycin (CLEOCIN) 150 MG capsule Take 2 capsules (300 mg total) by mouth 3 (three) times daily. May dispense as 150mg  capsules 09/17/20   Montine Circle, PA-C  oxyCODONE (OXY IR/ROXICODONE) 5 MG immediate release tablet Take 1 tablet (5 mg total) by mouth every 6 (six) hours as needed for severe pain (not releived by tylenol or ibuprofen). Patient not taking: Reported on 09/17/2020 06/11/20   Jill Alexanders, PA-C    Family History Family History  Problem Relation Age of Onset  . CAD Father     Social History Social History   Tobacco Use  . Smoking status: Never Smoker  . Smokeless tobacco: Never Used  Substance Use Topics  . Alcohol use: Not Currently  . Drug use: Not on file     Allergies   Penicillins   Review of Systems Review of Systems  Constitutional: Negative for chills and fever.  HENT: Positive for dental problem. Negative for drooling.   Neurological: Negative for speech difficulty.  Psychiatric/Behavioral: Positive for sleep disturbance.     Physical Exam Updated Vital Signs BP (!) 165/99   Pulse 88   Temp 98.7 F (  37.1 C) (Oral)   Resp 17   SpO2 98%   Physical Exam Physical Exam  Constitutional: Pt appears well-developed and well-nourished.  HENT:  Head: Normocephalic.  Right Ear: Tympanic membrane, external ear and ear canal normal.  Left Ear: Tympanic membrane, external ear and ear canal normal.  Nose: Nose normal. Right sinus exhibits no maxillary sinus tenderness and no frontal sinus tenderness. Left sinus exhibits no maxillary sinus tenderness and no frontal sinus tenderness.  Mouth/Throat: Uvula is midline,  oropharynx is clear and moist and mucous membranes are normal. No oral lesions. No uvula swelling or lacerations. No oropharyngeal exudate, posterior oropharyngeal edema, posterior oropharyngeal erythema or tonsillar abscesses.  Poor dentition No gingival swelling, fluctuance or induration No gross abscess  No sublingual edema, tenderness to palpation, or sign of Ludwig's angina, or deep space infection Pain at left lower molar Eyes: Conjunctivae are normal. Pupils are equal, round, and reactive to light. Right eye exhibits no discharge. Left eye exhibits no discharge.  Neck: Normal range of motion. Neck supple.  No stridor Handling secretions without difficulty No nuchal rigidity No cervical lymphadenopathy Cardiovascular: Normal rate, regular rhythm and normal heart sounds.   Pulmonary/Chest: Effort normal. No respiratory distress.  Equal chest rise  Abdominal: Soft. Bowel sounds are normal. Pt exhibits no distension. There is no tenderness.  Lymphadenopathy: Pt has no cervical adenopathy.  Neurological: Pt is alert and oriented x 4  Skin: Skin is warm and dry.  Psychiatric: Pt has a normal mood and affect.  Nursing note and vitals reviewed.   ED Treatments / Results  Labs (all labs ordered are listed, but only abnormal results are displayed) Labs Reviewed - No data to display  EKG    Radiology No results found.  Procedures Procedures (including critical care time)  Medications Ordered in ED Medications  clindamycin (CLEOCIN) capsule 300 mg (has no administration in time range)     Initial Impression / Assessment and Plan / ED Course  I have reviewed the triage vital signs and the nursing notes.  Pertinent labs & imaging results that were available during my care of the patient were reviewed by me and considered in my medical decision making (see chart for details).        Patient with dentalgia.  No abscess requiring immediate incision and drainage.  Exam not  concerning for Ludwig's angina or pharyngeal abscess.  Will treat with clinda. Pt instructed to follow-up with dentist.  Discussed return precautions. Pt safe for discharge.   Final Clinical Impressions(s) / ED Diagnoses   Final diagnoses:  Pain, dental    ED Discharge Orders         Ordered    clindamycin (CLEOCIN) 150 MG capsule  3 times daily        09/17/20 0253           Montine Circle, PA-C 29/79/89 2119    Delora Fuel, MD 41/74/08 [phone]

## 2021-10-03 NOTE — Progress Notes (Deleted)
Cardiology Office Note:    Date:  10/03/2021   ID:  Carly Ramos, DOB 12/25/1985, MRN [id number]  PCP:  Patient, No Pcp Per (Inactive)  Cardiologist:  None   Referring MD: Gustavus Bryant, PA-C   No chief complaint on file.   History of Present Illness:    Carly Ramos is a 35 y.o. female with a hx of possible OSA, PAF, primary hypertension, and palpitations. New complaint of DOE.   ***  Past Medical History:  Diagnosis Date   A-fib ([place])    Dyspnea on exertion    Fibroids    Hypertension    Hypothyroidism    OSA (obstructive sleep apnea)    Racing heart beat    SOB (shortness of breath) on exertion     Past Surgical History:  Procedure Laterality Date   CHOLECYSTECTOMY N/A 06/10/2020   Procedure: LAPAROSCOPIC CHOLECYSTECTOMY WITH ATTEMPTED INTRAOPERATIVE CHOLANGIOGRAM;  Surgeon: Greer Pickerel, MD;  Location: [facility];  Service: General;  Laterality: N/A;    Current Medications: No outpatient medications have been marked as taking for the 10/05/21 encounter (Appointment) with Belva Crome, MD.     Allergies:   Penicillins   Social History   Socioeconomic History   Marital status: Single    Spouse name: Not on file   Number of children: Not on file   Years of education: Not on file   Highest education level: Not on file  Occupational History   Not on file  Tobacco Use   Smoking status: Never   Smokeless tobacco: Never  Substance and Sexual Activity   Alcohol use: Not Currently   Drug use: Not on file   Sexual activity: Not on file  Other Topics Concern   Not on file  Social History Narrative   Not on file   Social Determinants of Health   Financial Resource Strain: Not on file  Food Insecurity: Not on file  Transportation Needs: Not on file  Physical Activity: Not on file  Stress: Not on file  Social Connections: Not on file     Family History: The patient's family history includes CAD in her father.  ROS:   Please see the history of  present illness.    *** All other systems reviewed and are negative.  EKGs/Labs/Other Studies Reviewed:    The following studies were reviewed today: ***  EKG:  EKG ***  Recent Labs: No results found for requested labs within last 8760 hours.  Recent Lipid Panel    Component Value Date/Time   CHOL 109 06/09/2020 0609   TRIG 46 06/09/2020 0609   HDL 42 06/09/2020 0609   CHOLHDL 2.6 06/09/2020 0609   VLDL 9 06/09/2020 0609   LDLCALC 58 06/09/2020 0609    Physical Exam:    VS:  There were no vitals taken for this visit.    Wt Readings from Last 3 Encounters:  06/08/20 180 lb (81.6 kg)  03/03/19 170 lb (77.1 kg)     GEN: ***. No acute distress HEENT: Normal NECK: No JVD. LYMPHATICS: No lymphadenopathy CARDIAC: *** murmur. RRR *** gallop, or edema. VASCULAR: *** Normal Pulses. No bruits. RESPIRATORY:  Clear to auscultation without rales, wheezing or rhonchi  ABDOMEN: Soft, non-tender, non-distended, No pulsatile mass, MUSCULOSKELETAL: No deformity  SKIN: Warm and dry NEUROLOGIC:  Alert and oriented x 3 PSYCHIATRIC:  Normal affect   ASSESSMENT:    1. DOE (dyspnea on exertion)   2. AF (paroxysmal atrial fibrillation) ([place])   3. Essential  hypertension   4. OSA (obstructive sleep apnea)    PLAN:    In order of problems listed above:  ***   Medication Adjustments/Labs and Tests Ordered: Current medicines are reviewed at length with the patient today.  Concerns regarding medicines are outlined above.  No orders of the defined types were placed in this encounter.  No orders of the defined types were placed in this encounter.   There are no Patient Instructions on file for this visit.   Signed, Sinclair Grooms, MD  10/03/2021 6:53 PM    [place]

## 2021-10-05 ENCOUNTER — Ambulatory Visit: Payer: Medicaid - Out of State | Admitting: Interventional Cardiology

## 2021-11-05 NOTE — Progress Notes (Signed)
Cardiology Office Note:    Date:  11/09/2021   ID:  Carly Ramos, DOB 1986/01/22, MRN [id number]  PCP:  Carly Bryant, PA-C   [facility] Providers Cardiologist:  None {   Referring MD: Carly Bryant, PA-C    History of Present Illness:    Carly Ramos is a 35 y.o. female with a hx of HTN, OSA, DMII and Afib who was referred by Carly Ser, PA-C for further evaluation of Afib and dyspnea on exertion.  The patient states that she was diagnosed with Afib about 3 years ago where she developed palpitations. Went to [facility] hospital which confirmed Afib s/p DCCV. Was initially on [facility] which was subsequently stopped due to low CHADs-vasc. Was started on atenolol for rate control. Notably, she was about 38months post-partum at that time and was pumping. She was doing well until a couple of months ago with worsening dyspnea on exertion and palpitations following COVID infection in 05/2021.  Also notes that her heart rate spikes high with exertion. Symptoms are worsened with missing doses of her atenolol. Palpitations are occurring on average 2x/week lasting anywhere from 30-46min. No associated chest pain, lightheadedness or dizziness. Not currently on [facility]. Per apple watch, has been notified of "fast HR" but no "afib" notifications.   Past Medical History:  Diagnosis Date   A-fib (Carly Ramos)    COVID    Diabetes (Carly Ramos)    Dyspnea on exertion    Fibroids    Hypertension    Hypokalemia    OSA (obstructive sleep apnea)    Racing heart beat    SOB (shortness of breath) on exertion     Past Surgical History:  Procedure Laterality Date   CHOLECYSTECTOMY N/A 06/10/2020   Procedure: LAPAROSCOPIC CHOLECYSTECTOMY WITH ATTEMPTED INTRAOPERATIVE CHOLANGIOGRAM;  Surgeon: Carly Pickerel, MD;  Location: [facility];  Service: General;  Laterality: N/A;    Current Medications: Current Meds  Medication Sig   acetaminophen (TYLENOL) 500 MG tablet Take 500 mg by mouth every 6 (six) hours as needed for mild pain or  moderate pain.   amLODipine (NORVASC) 10 MG tablet Take 10 mg by mouth daily as needed (blood pressure).    aspirin 81 MG EC tablet Take 1 tablet by mouth daily.   ferrous sulfate 325 (65 FE) MG tablet Take 325 mg by mouth daily with breakfast.   metoprolol succinate (TOPROL XL) 25 MG 24 hr tablet Take 1 tablet (25 mg total) by mouth daily.   Multiple Vitamins-Minerals (MULTIVITAMIN ADULT PO) Take 1 tablet by mouth daily. gummies   Potassium 99 MG TABS Take by mouth.   [DISCONTINUED] atenolol (TENORMIN) 25 MG tablet Take 25 mg by mouth daily.     Allergies:   Penicillins   Social History   Socioeconomic History   Marital status: Single    Spouse name: Not on file   Number of children: Not on file   Years of education: Not on file   Highest education level: Not on file  Occupational History   Not on file  Tobacco Use   Smoking status: Never   Smokeless tobacco: Never  Substance and Sexual Activity   Alcohol use: Not Currently   Drug use: Not on file   Sexual activity: Not on file  Other Topics Concern   Not on file  Social History Narrative   Not on file   Social Determinants of Health   Financial Resource Strain: Not on file  Food Insecurity: Not on file  Transportation Needs:  Not on file  Physical Activity: Not on file  Stress: Not on file  Social Connections: Not on file     Family History: The patient's family history includes CAD in her father.  ROS:   Please see the history of present illness.    Review of Systems  Constitutional:  Positive for malaise/fatigue. Negative for chills and fever.  Respiratory:  Positive for shortness of breath.   Cardiovascular:  Positive for palpitations. Negative for chest pain, orthopnea, claudication, leg swelling and PND.  Gastrointestinal:  Negative for nausea and vomiting.  Musculoskeletal:  Negative for falls.  Neurological:  Negative for dizziness and loss of consciousness.    EKGs/Labs/Other Studies Reviewed:     The following studies were reviewed today: TTE 04/29/19 OSH FINDINGS:    Left Ventricle: Normal left ventricular cavity size. Normal left ventricular wall thickness.   Left Ventricle Normal global left ventricular systolic function, ejection fraction is 65-70%. Impaired relaxation   Function: filling pattern for age.    LVEF: 66 %     Left Atrium: Normal left atrial size.   Right Ventricle: Normal right ventricular size.   Right Atrium: Normal right atrial size. IVC collapses >50% with inspiration consistent with normal venous   pressure.   Mitral Valve: Structurally/functionally normal mitral valve. Trace mitral regurgitation.   Aortic Valve: Structurally normal aortic valve. No evidence of aortic regurgitation. No evidence of aortic   stenosis.   Tricuspid Valve: Normally structured tricuspid valve. Mild tricuspid regurgitation, estimated right ventricular   systolic pressure is 29.7 mmHg.   Pulmonic Valve: Structurally normal pulmonic valve. Trace pulmonic regurgitation.   Aorta: Normal aortic root diameter.   Pericardium: No pericardial effusion.   Masses / Shunts:   EKG:  EKG is  ordered today.  The ekg ordered today demonstrates NSR with HR 66  Recent Labs: No results found for requested labs within last 8760 hours.  Recent Lipid Panel    Component Value Date/Time   CHOL 109 06/09/2020 0609   TRIG 46 06/09/2020 0609   HDL 42 06/09/2020 0609   CHOLHDL 2.6 06/09/2020 0609   VLDL 9 06/09/2020 0609   LDLCALC 58 06/09/2020 0609     Risk Assessment/Calculations:    CHA2DS2-VASc Score = 2  This indicates a 2.2% annual risk of stroke. The patient's score is based upon: CHF History: 0 HTN History: 1 Diabetes History: 0 Stroke History: 0 Vascular Disease History: 0 Age Score: 0 Gender Score: 1           Physical Exam:    VS:  BP 132/82    Pulse 66    Ht 5\' 2"  (1.575 m)    Wt 167 lb 12.8 oz (76.1 kg)    SpO2 99%    BMI 30.69 kg/m     Wt Readings from Last 3  Encounters:  11/09/21 167 lb 12.8 oz (76.1 kg)  06/08/20 180 lb (81.6 kg)  03/03/19 170 lb (77.1 kg)     GEN:  Well nourished, well developed in no acute distress HEENT: Normal NECK: No JVD; No carotid bruits CARDIAC: RRR, no murmurs, rubs, gallops RESPIRATORY:  Clear to auscultation without rales, wheezing or rhonchi  ABDOMEN: Soft, non-tender, non-distended MUSCULOSKELETAL:  No edema; No deformity  SKIN: Warm and dry NEUROLOGIC:  Alert and oriented x 3 PSYCHIATRIC:  Normal affect   ASSESSMENT:    1. AF (paroxysmal atrial fibrillation) ([place])   2. Essential hypertension   3. Dyspnea on exertion    PLAN:  In order of problems listed above:  #Known Afib: CHADs-vasc 2 for gender and HTN. Patient developed Afib about 3 years ago where she was seen in [facility] ER s/p DCCV. ? Triggered by postpartum state. Was initially on [facility] which was later stopped due to low CHADs risk. No know recurrence, however, now with recurrent palpitations with associated dyspnea. Will check zio, TTE. -Check zio x 14days -Change atenolol to metoprolol 25mg  XL daily -Check TTE given dyspnea on exertion -Discussed AC at length today and patient would like to wait until has results of monitor before starting; could be candidate for loop as well  #DOE: Patient has been having signfiicant dyspnea on exertion following COVID infection. No associated chest pain, lightheadedness or dizziness. Has been having increased frequency of palpitations as well. Will check monitor and TTE as above. If reassuring, could consider CTA/Ca score. -Check TTE -Zio as above -If negative, consider CTA vs Ca score  #HTN: Controlled. -Continue amlodipine 10mg  daily -Change atenolol to metop 25mg  XL daily        Medication Adjustments/Labs and Tests Ordered: Current medicines are reviewed at length with the patient today.  Concerns regarding medicines are outlined above.  Orders Placed This Encounter  Procedures   LONG TERM  MONITOR (3-14 DAYS)   EKG 12-Lead   ECHOCARDIOGRAM COMPLETE   Meds ordered this encounter  Medications   metoprolol succinate (TOPROL XL) 25 MG 24 hr tablet    Sig: Take 1 tablet (25 mg total) by mouth daily.    Dispense:  90 tablet    Refill:  2    Patient Instructions  Medication Instructions:   STOP TAKING ATENOLOL NOW  START TAKING METOPROLOL SUCCINATE (TOPROL XL) 25 MG BY MOUTH DAILY  *If you need a refill on your cardiac medications before your next appointment, please call your pharmacy*   Testing/Procedures:  Your physician has requested that you have an echocardiogram. Echocardiography is a painless test that uses sound waves to create images of your heart. It provides your doctor with information about the size and shape of your heart and how well your hearts chambers and valves are working. This procedure takes approximately one hour. There are no restrictions for this procedure.   ZIO XT- Long Term Monitor Instructions  Your physician has requested you wear a ZIO patch monitor for 14 days.  This is a single patch monitor. [employer] supplies one patch monitor per enrollment. Additional stickers are not available. Please do not apply patch if you will be having a Nuclear Stress Test,  Echocardiogram, Cardiac CT, MRI, or Chest Xray during the period you would be wearing the  monitor. The patch cannot be worn during these tests. You cannot remove and re-apply the  ZIO XT patch monitor.  Your ZIO patch monitor will be mailed 3 day USPS to your address on file. It may take 3-5 days  to receive your monitor after you have been enrolled.  Once you have received your monitor, please review the enclosed instructions. Your monitor  has already been registered assigning a specific monitor serial # to you.  Billing and Patient Assistance Program Information  We have supplied [employer] with any of your insurance information on file for billing purposes. [employer] offers a sliding  scale Patient Assistance Program for patients that do not have  insurance, or whose insurance does not completely cover the cost of the ZIO monitor.  You must apply for the Patient Assistance Program to qualify for this discounted rate.  To  apply, please call [employer] at [phone], select option 4, select option 2, ask to apply for  Patient Assistance Program. Theodore Demark will ask your household income, and how many people  are in your household. They will quote your out-of-pocket cost based on that information.  [employer] will also be able to set up a 58-month, interest-free payment plan if needed.  Applying the monitor   Shave hair from upper left chest.  Hold abrader disc by orange tab. Rub abrader in 40 strokes over the upper left chest as  indicated in your monitor instructions.  Clean area with 4 enclosed alcohol pads. Let dry.  Apply patch as indicated in monitor instructions. Patch will be placed under collarbone on left  side of chest with arrow pointing upward.  Rub patch adhesive wings for 2 minutes. Remove white label marked "1". Remove the white  label marked "2". Rub patch adhesive wings for 2 additional minutes.  While looking in a mirror, press and release button in center of patch. A small green light will  flash 3-4 times. This will be your only indicator that the monitor has been turned on.  Do not shower for the first 24 hours. You may shower after the first 24 hours.  Press the button if you feel a symptom. You will hear a small click. Record Date, Time and  Symptom in the Patient Logbook.  When you are ready to remove the patch, follow instructions on the last 2 pages of Patient  Logbook. Stick patch monitor onto the last page of Patient Logbook.  Place Patient Logbook in the blue and white box. Use locking tab on box and tape box closed  securely. The blue and white box has prepaid postage on it. Please place it in the mailbox as  soon as possible. Your physician should  have your test results approximately 7 days after the  monitor has been mailed back to [facility].  Call [place] at [phone] if you have questions regarding  your ZIO XT patch monitor. Call them immediately if you see an orange light blinking on your  monitor.  If your monitor falls off in less than 4 days, contact our Monitor department at [phone].  If your monitor becomes loose or falls off after 4 days call [employer] at [phone] for  suggestions on securing your monitor   Follow-Up: At [facility], you and your health needs are our priority.  As part of our continuing mission to provide you with exceptional heart care, we have created designated Provider Care Teams.  These Care Teams include your primary Cardiologist (physician) and Advanced Practice Providers (APPs -  Physician Assistants and Nurse Practitioners) who all work together to provide you with the care you need, when you need it.  We recommend signing up for the patient portal called "MyChart".  Sign up information is provided on this After Visit Summary.  MyChart is used to connect with patients for Virtual Visits (Telemedicine).  Patients are able to view lab/test results, encounter notes, upcoming appointments, etc.  Non-urgent messages can be sent to your provider as well.   To learn more about what you can do with MyChart, go to [email or web address].    Your next appointment:   6 month(s)  The format for your next appointment:   In Person  Provider:   DR. Johney Frame    Signed, Freada Bergeron, MD  11/09/2021 3:51 PM    [place]

## 2021-11-09 ENCOUNTER — Ambulatory Visit (INDEPENDENT_AMBULATORY_CARE_PROVIDER_SITE_OTHER): Payer: BLUE CROSS/BLUE SHIELD

## 2021-11-09 ENCOUNTER — Other Ambulatory Visit: Payer: Self-pay

## 2021-11-09 ENCOUNTER — Telehealth: Payer: Self-pay | Admitting: *Deleted

## 2021-11-09 ENCOUNTER — Ambulatory Visit: Payer: BLUE CROSS/BLUE SHIELD | Admitting: Cardiology

## 2021-11-09 ENCOUNTER — Encounter: Payer: Self-pay | Admitting: Cardiology

## 2021-11-09 VITALS — BP 132/82 | HR 66 | Ht 62.0 in | Wt 167.8 lb

## 2021-11-09 DIAGNOSIS — R0609 Other forms of dyspnea: Secondary | ICD-10-CM

## 2021-11-09 DIAGNOSIS — I10 Essential (primary) hypertension: Secondary | ICD-10-CM

## 2021-11-09 DIAGNOSIS — I480 Paroxysmal atrial fibrillation: Secondary | ICD-10-CM

## 2021-11-09 MED ORDER — METOPROLOL SUCCINATE ER 25 MG PO TB24: 25.0000 mg | ORAL_TABLET | Freq: Every day | ORAL | 2 refills | Status: DC

## 2021-11-09 NOTE — Progress Notes (Unsigned)
Enrolled patient for a 14 day Zio XT  monitor to be mailed to patients home  °

## 2021-11-09 NOTE — Telephone Encounter (Signed)
-----   Message from Gerarda Gunther sent at 11/09/2021  3:36 PM EST ----- Regarding: RE: 2 WEEK ZIO PER [place] Done :-) ----- Message ----- From: Nuala Alpha, LPN Sent: 05/09/4036   3:28 PM EST To: Nuala Alpha, LPN, Jennefer Bravo, # Subject: 2 WEEK ZIO PER [place]                       Dr. Johney Frame ordered a 2 week zio on this pt for afib and palp. Order is in the system and pt aware you will enroll.  Can you please enroll and let me know when you do?  Thanks, [employer]

## 2021-11-09 NOTE — Patient Instructions (Addendum)
Medication Instructions:   STOP TAKING ATENOLOL NOW  START TAKING METOPROLOL SUCCINATE (TOPROL XL) 25 MG BY MOUTH DAILY  *If you need a refill on your cardiac medications before your next appointment, please call your pharmacy*   Testing/Procedures:  Your physician has requested that you have an echocardiogram. Echocardiography is a painless test that uses sound waves to create images of your heart. It provides your doctor with information about the size and shape of your heart and how well your hearts chambers and valves are working. This procedure takes approximately one hour. There are no restrictions for this procedure.   ZIO XT- Long Term Monitor Instructions  Your physician has requested you wear a ZIO patch monitor for 14 days.  This is a single patch monitor. [employer] supplies one patch monitor per enrollment. Additional stickers are not available. Please do not apply patch if you will be having a Nuclear Stress Test,  Echocardiogram, Cardiac CT, MRI, or Chest Xray during the period you would be wearing the  monitor. The patch cannot be worn during these tests. You cannot remove and re-apply the  ZIO XT patch monitor.  Your ZIO patch monitor will be mailed 3 day USPS to your address on file. It may take 3-5 days  to receive your monitor after you have been enrolled.  Once you have received your monitor, please review the enclosed instructions. Your monitor  has already been registered assigning a specific monitor serial # to you.  Billing and Patient Assistance Program Information  We have supplied [employer] with any of your insurance information on file for billing purposes. [employer] offers a sliding scale Patient Assistance Program for patients that do not have  insurance, or whose insurance does not completely cover the cost of the ZIO monitor.  You must apply for the Patient Assistance Program to qualify for this discounted rate.  To apply, please call [employer] at  [phone], select option 4, select option 2, ask to apply for  Patient Assistance Program. Theodore Demark will ask your household income, and how many people  are in your household. They will quote your out-of-pocket cost based on that information.  [employer] will also be able to set up a 73-month, interest-free payment plan if needed.  Applying the monitor   Shave hair from upper left chest.  Hold abrader disc by orange tab. Rub abrader in 40 strokes over the upper left chest as  indicated in your monitor instructions.  Clean area with 4 enclosed alcohol pads. Let dry.  Apply patch as indicated in monitor instructions. Patch will be placed under collarbone on left  side of chest with arrow pointing upward.  Rub patch adhesive wings for 2 minutes. Remove white label marked "1". Remove the white  label marked "2". Rub patch adhesive wings for 2 additional minutes.  While looking in a mirror, press and release button in center of patch. A small green light will  flash 3-4 times. This will be your only indicator that the monitor has been turned on.  Do not shower for the first 24 hours. You may shower after the first 24 hours.  Press the button if you feel a symptom. You will hear a small click. Record Date, Time and  Symptom in the Patient Logbook.  When you are ready to remove the patch, follow instructions on the last 2 pages of Patient  Logbook. Stick patch monitor onto the last page of Patient Logbook.  Place Patient Logbook in the blue and white box.  Use locking tab on box and tape box closed  securely. The blue and white box has prepaid postage on it. Please place it in the mailbox as  soon as possible. Your physician should have your test results approximately 7 days after the  monitor has been mailed back to [facility].  Call [facility] at [phone] if you have questions regarding  your ZIO XT patch monitor. Call them immediately if you see an orange light  blinking on your  monitor.  If your monitor falls off in less than 4 days, contact our Monitor department at [phone].  If your monitor becomes loose or falls off after 4 days call [employer] at [phone] for  suggestions on securing your monitor   Follow-Up: At [facility], you and your health needs are our priority.  As part of our continuing mission to provide you with exceptional heart care, we have created designated Provider Care Teams.  These Care Teams include your primary Cardiologist (physician) and Advanced Practice Providers (APPs -  Physician Assistants and Nurse Practitioners) who all work together to provide you with the care you need, when you need it.  We recommend signing up for the patient portal called "MyChart".  Sign up information is provided on this After Visit Summary.  MyChart is used to connect with patients for Virtual Visits (Telemedicine).  Patients are able to view lab/test results, encounter notes, upcoming appointments, etc.  Non-urgent messages can be sent to your provider as well.   To learn more about what you can do with MyChart, go to [email or web address].    Your next appointment:   6 month(s)  The format for your next appointment:   In Person  Provider:   DR. Johney Frame

## 2021-11-18 NOTE — Telephone Encounter (Signed)
Patient is following up. She states she has not received her heart monitor and she is requesting a tracking update if possible. She has concerns that she will not be able to complete her monitoring cycle prior to 02/01 echo. She also mentioned [address] # [id number] was missing from the address we have for her, which may be why it has not been delivered yet. I added in trailer # on address. Is anyone able to verify the address the monitor was sent to and/or provide tracking update for her? Please advise.

## 2021-11-19 NOTE — Telephone Encounter (Signed)
Returned call to patient. A monitor was mailed to her but returned because of the missing information on her address it was returned to [place]. I have contracted [employer] and updated her address. A new monitor has been shipped and she should received in a few days.

## 2021-11-22 DIAGNOSIS — I480 Paroxysmal atrial fibrillation: Secondary | ICD-10-CM | POA: Diagnosis not present

## 2021-12-08 ENCOUNTER — Ambulatory Visit (HOSPITAL_COMMUNITY): Payer: BLUE CROSS/BLUE SHIELD | Attending: Cardiology

## 2021-12-08 ENCOUNTER — Other Ambulatory Visit: Payer: Self-pay

## 2021-12-08 DIAGNOSIS — I480 Paroxysmal atrial fibrillation: Secondary | ICD-10-CM

## 2021-12-08 LAB — ECHOCARDIOGRAM COMPLETE
Area-P 1/2: 3.81 cm2
S' Lateral: 2.4 cm

## 2021-12-14 ENCOUNTER — Other Ambulatory Visit: Payer: Self-pay

## 2021-12-14 ENCOUNTER — Emergency Department (HOSPITAL_BASED_OUTPATIENT_CLINIC_OR_DEPARTMENT_OTHER): Payer: BLUE CROSS/BLUE SHIELD

## 2021-12-14 ENCOUNTER — Emergency Department (HOSPITAL_BASED_OUTPATIENT_CLINIC_OR_DEPARTMENT_OTHER)
Admission: EM | Admit: 2021-12-14 | Discharge: 2021-12-14 | Disposition: A | Discharge status: Home / Self Care | Payer: BLUE CROSS/BLUE SHIELD | Attending: Emergency Medicine | Admitting: Emergency Medicine

## 2021-12-14 ENCOUNTER — Encounter (HOSPITAL_BASED_OUTPATIENT_CLINIC_OR_DEPARTMENT_OTHER): Payer: Self-pay

## 2021-12-14 DIAGNOSIS — K529 Noninfective gastroenteritis and colitis, unspecified: Secondary | ICD-10-CM

## 2021-12-14 DIAGNOSIS — E876 Hypokalemia: Secondary | ICD-10-CM | POA: Insufficient documentation

## 2021-12-14 DIAGNOSIS — D649 Anemia, unspecified: Secondary | ICD-10-CM | POA: Insufficient documentation

## 2021-12-14 DIAGNOSIS — Z79899 Other long term (current) drug therapy: Secondary | ICD-10-CM | POA: Diagnosis not present

## 2021-12-14 DIAGNOSIS — R112 Nausea with vomiting, unspecified: Secondary | ICD-10-CM | POA: Diagnosis not present

## 2021-12-14 DIAGNOSIS — I10 Essential (primary) hypertension: Secondary | ICD-10-CM | POA: Insufficient documentation

## 2021-12-14 DIAGNOSIS — Z7982 Long term (current) use of aspirin: Secondary | ICD-10-CM | POA: Diagnosis not present

## 2021-12-14 DIAGNOSIS — E119 Type 2 diabetes mellitus without complications: Secondary | ICD-10-CM | POA: Diagnosis not present

## 2021-12-14 DIAGNOSIS — R109 Unspecified abdominal pain: Secondary | ICD-10-CM | POA: Diagnosis present

## 2021-12-14 DIAGNOSIS — Z20822 Contact with and (suspected) exposure to covid-19: Secondary | ICD-10-CM | POA: Insufficient documentation

## 2021-12-14 DIAGNOSIS — R1084 Generalized abdominal pain: Secondary | ICD-10-CM | POA: Diagnosis not present

## 2021-12-14 LAB — CBC
HCT: 33.1 % — ABNORMAL LOW (ref 36.0–46.0)
Hemoglobin: 11 g/dL — ABNORMAL LOW (ref 12.0–15.0)
MCH: 30.4 pg (ref 26.0–34.0)
MCHC: 33.2 g/dL (ref 30.0–36.0)
MCV: 91.4 fL (ref 80.0–100.0)
Platelets: 311 10*3/uL (ref 150–400)
RBC: 3.62 MIL/uL — ABNORMAL LOW (ref 3.87–5.11)
RDW: 14 % (ref 11.5–15.5)
WBC: 6.9 10*3/uL (ref 4.0–10.5)
nRBC: 0 % (ref 0.0–0.2)

## 2021-12-14 LAB — RESP PANEL BY RT-PCR (FLU A&B, COVID) ARPGX2
Influenza A by PCR: NEGATIVE
Influenza B by PCR: NEGATIVE
SARS Coronavirus 2 by RT PCR: NEGATIVE

## 2021-12-14 LAB — COMPREHENSIVE METABOLIC PANEL
ALT: 11 U/L (ref 0–44)
AST: 14 U/L — ABNORMAL LOW (ref 15–41)
Albumin: 4.1 g/dL (ref 3.5–5.0)
Alkaline Phosphatase: 39 U/L (ref 38–126)
Anion gap: 10 (ref 5–15)
BUN: 6 mg/dL (ref 6–20)
CO2: 26 mmol/L (ref 22–32)
Calcium: 9.2 mg/dL (ref 8.9–10.3)
Chloride: 103 mmol/L (ref 98–111)
Creatinine, Ser: 0.72 mg/dL (ref 0.44–1.00)
GFR, Estimated: >60 mL/min (ref >60)
Glucose, Bld: 100 mg/dL — ABNORMAL HIGH (ref 70–99)
Potassium: 3 mmol/L — ABNORMAL LOW (ref 3.5–5.1)
Sodium: 139 mmol/L (ref 135–145)
Total Bilirubin: 0.3 mg/dL (ref 0.3–1.2)
Total Protein: 7.7 g/dL (ref 6.5–8.1)

## 2021-12-14 LAB — URINALYSIS, ROUTINE W REFLEX MICROSCOPIC

## 2021-12-14 LAB — LIPASE, BLOOD: Lipase: 40 U/L (ref 11–51)

## 2021-12-14 LAB — URINALYSIS, MICROSCOPIC (REFLEX)
Bacteria, UA: NONE SEEN
RBC / HPF: >50 RBC/hpf (ref 0–5)

## 2021-12-14 LAB — PREGNANCY, URINE: Preg Test, Ur: NEGATIVE

## 2021-12-14 MED ORDER — LACTATED RINGERS IV BOLUS
1000.0000 mL | Freq: Once | INTRAVENOUS | Status: AC
  Administered 2021-12-14: 1000 mL via INTRAVENOUS

## 2021-12-14 MED ORDER — IOHEXOL 300 MG/ML  SOLN
100.0000 mL | Freq: Once | INTRAMUSCULAR | Status: AC | PRN
  Administered 2021-12-14: 100 mL via INTRAVENOUS

## 2021-12-14 MED ORDER — ONDANSETRON 4 MG PO TBDP: 4.0000 mg | ORAL_TABLET | Freq: Three times a day (TID) | ORAL | 0 refills | Status: DC | PRN

## 2021-12-14 NOTE — Discharge Instructions (Signed)
You are seen in the ER today for your nausea and vomiting after eating.  It is possible you have a viral illness causing your nausea and vomiting as well as your loose stools.  You been prescribed a nausea medication to take as needed.  Additionally your CT scan today revealed several fibroids in your uterus largest of which is 8 cm in diameter.  These are likely the cause of your heavy periods and painful cramping.  Below is the contact information of both the on-call OB/GYN and another gynecology center in the [place] area.  Please call them to schedule follow-up appointment from the ER for large fibroids.  Return to the ER if you develop any new severe symptoms.

## 2021-12-14 NOTE — ED Provider Notes (Signed)
[name] Provider Note   CSN: [id number] Arrival date & time: 12/14/21  1546     History  Chief Complaint  Patient presents with   Abdominal Pain    Carly Ramos is a 36 y.o. female with known history of uterine fibroids who presents with concern for 4 days of progressively worsening generalized abdominal pain with associated postprandial nausea, vomiting and a few episodes of loose stool.  She denies fevers or chills or any URI symptoms.  She states that symptoms started after she ate at [facility] though none of the rest of her family became ill.  Patient does have history of cholecystectomy few years ago and has not had any problems since that procedure.  Of note she does note that over the last few months she has been having progressively heavier.'s with worsening cramping and prolongation of her cycle to 10 days.  She has been trying to get in with an OB/GYN in the area but has been unsuccessful in scheduling an appointment.  She is currently on day 5 of her menstrual cycle at this time.  Have personally reviewed her medical records.  She has history of hypertension, hyperkalemia, paroxysmal A-fib, obstructive sleep apnea, diabetes, and uterine fibroids.  She is not anticoagulated.  HPI     Home Medications Prior to Admission medications   Medication Sig Start Date End Date Taking? Authorizing Provider  acetaminophen (TYLENOL) 500 MG tablet Take 500 mg by mouth every 6 (six) hours as needed for mild pain or moderate pain.    [provider]  amLODipine (NORVASC) 10 MG tablet Take 10 mg by mouth daily as needed (blood pressure).  11/05/18   [provider]  aspirin 81 MG EC tablet Take 1 tablet by mouth daily. 03/06/20   [provider]  clindamycin (CLEOCIN) 150 MG capsule Take 2 capsules (300 mg total) by mouth 3 (three) times daily. May dispense as 150mg  capsules Patient not taking: Reported on 11/09/2021 09/17/20    Montine Circle, PA-C  ferrous sulfate 325 (65 FE) MG tablet Take 325 mg by mouth daily with breakfast.    [provider]  ibuprofen (ADVIL) 200 MG tablet Take 2 tablets (400 mg total) by mouth every 8 (eight) hours as needed for mild pain or moderate pain. Patient not taking: Reported on 11/09/2021 06/11/20   Jill Alexanders, PA-C  metoprolol succinate (TOPROL XL) 25 MG 24 hr tablet Take 1 tablet (25 mg total) by mouth daily. 11/09/21   Freada Bergeron, MD  Multiple Vitamins-Minerals (MULTIVITAMIN ADULT PO) Take 1 tablet by mouth daily. gummies    [provider]  oxyCODONE (OXY IR/ROXICODONE) 5 MG immediate release tablet Take 1 tablet (5 mg total) by mouth every 6 (six) hours as needed for severe pain (not releived by tylenol or ibuprofen). Patient not taking: Reported on 11/09/2021 06/11/20   Jill Alexanders, PA-C  Potassium 99 MG TABS Take by mouth.    [provider]      Allergies    Penicillins    Review of Systems   Review of Systems  Constitutional:  Positive for appetite change and fatigue. Negative for activity change and fever.  HENT: Negative.    Eyes: Negative.   Respiratory: Negative.    Gastrointestinal:  Positive for abdominal pain, diarrhea, nausea and vomiting. Negative for blood in stool.  Genitourinary:  Positive for menstrual problem and vaginal bleeding. Negative for dysuria, vaginal discharge and vaginal pain.  Neurological: Negative.  Negative for tremors, syncope and weakness.   Physical Exam Updated Vital Signs BP 120/77    Pulse 66    Temp 97.8 F (36.6 C)    Resp 15    Ht 5\' 2"  (1.575 m)    Wt 76.1 kg    SpO2 99%    BMI 30.69 kg/m  Physical Exam Vitals and nursing note reviewed.  Constitutional:      Appearance: She is overweight. She is not ill-appearing or toxic-appearing.  HENT:     Head: Normocephalic and atraumatic.     Nose: Nose normal.     Mouth/Throat:     Mouth: Mucous membranes are moist.     Pharynx:  Oropharynx is clear. Uvula midline. No oropharyngeal exudate or posterior oropharyngeal erythema.     Tonsils: No tonsillar exudate.  Eyes:     General: Lids are normal. Vision grossly intact.        Right eye: No discharge.        Left eye: No discharge.     Extraocular Movements: Extraocular movements intact.     Conjunctiva/sclera: Conjunctivae normal.     Pupils: Pupils are equal, round, and reactive to light.  Cardiovascular:     Rate and Rhythm: Normal rate and regular rhythm.     Pulses: Normal pulses.     Heart sounds: Normal heart sounds. No murmur heard. Pulmonary:     Effort: Pulmonary effort is normal. No tachypnea or respiratory distress.     Breath sounds: Normal breath sounds. No wheezing or rales.  Chest:     Chest wall: No mass, lacerations, deformity, swelling or tenderness.  Abdominal:     General: Bowel sounds are normal. There is no distension.     Palpations: Abdomen is soft.     Tenderness: There is generalized abdominal tenderness. There is no right CVA tenderness, left CVA tenderness, guarding or rebound.  Musculoskeletal:        General: No deformity.     Cervical back: Normal range of motion and neck supple.     Right lower leg: No edema.     Left lower leg: No edema.  Lymphadenopathy:     Cervical: No cervical adenopathy.  Skin:    General: Skin is warm and dry.     Capillary Refill: Capillary refill takes less than 2 seconds.  Neurological:     General: No focal deficit present.     Mental Status: She is alert and oriented to person, place, and time. Mental status is at baseline.  Psychiatric:        Mood and Affect: Mood normal.    ED Results / Procedures / Treatments   Labs (all labs ordered are listed, but only abnormal results are displayed) Labs Reviewed  COMPREHENSIVE METABOLIC PANEL - Abnormal; Notable for the following components:      Result Value   Potassium 3.0 (*)    Glucose, Bld 100 (*)    AST 14 (*)    All other components  within normal limits  CBC - Abnormal; Notable for the following components:   RBC 3.62 (*)    Hemoglobin 11.0 (*)    HCT 33.1 (*)    All other components within normal limits  URINALYSIS, ROUTINE W REFLEX MICROSCOPIC - Abnormal; Notable for the following components:   Color, Urine RED (*)    APPearance TURBID (*)    Glucose, UA   (*)    Value: TEST NOT REPORTED DUE TO COLOR INTERFERENCE OF URINE  PIGMENT   Hgb urine dipstick   (*)    Value: TEST NOT REPORTED DUE TO COLOR INTERFERENCE OF URINE PIGMENT   Bilirubin Urine   (*)    Value: TEST NOT REPORTED DUE TO COLOR INTERFERENCE OF URINE PIGMENT   Ketones, ur   (*)    Value: TEST NOT REPORTED DUE TO COLOR INTERFERENCE OF URINE PIGMENT   Protein, ur   (*)    Value: TEST NOT REPORTED DUE TO COLOR INTERFERENCE OF URINE PIGMENT   Nitrite   (*)    Value: TEST NOT REPORTED DUE TO COLOR INTERFERENCE OF URINE PIGMENT   Leukocytes,Ua   (*)    Value: TEST NOT REPORTED DUE TO COLOR INTERFERENCE OF URINE PIGMENT   All other components within normal limits  LIPASE, BLOOD  PREGNANCY, URINE  URINALYSIS, MICROSCOPIC (REFLEX)    EKG None  Radiology CT Abdomen Pelvis W Contrast  Result Date: 12/14/2021 CLINICAL DATA:  Abdomen pain with emesis EXAM: CT ABDOMEN AND PELVIS WITH CONTRAST TECHNIQUE: Multidetector CT imaging of the abdomen and pelvis was performed using the standard protocol following bolus administration of intravenous contrast. RADIATION DOSE REDUCTION: This exam was performed according to the departmental dose-optimization program which includes automated exposure control, adjustment of the mA and/or kV according to patient size and/or use of iterative reconstruction technique. CONTRAST:  196mL OMNIPAQUE IOHEXOL 300 MG/ML  SOLN COMPARISON:  MRI 06/09/2020, CT 06/08/2020 FINDINGS: Lower chest: Lung bases demonstrate no acute consolidation or pleural effusion. Normal cardiac size. Hepatobiliary: Status post cholecystectomy. No biliary  dilatation or focal hepatic abnormality. Pancreas: Unremarkable. No pancreatic ductal dilatation or surrounding inflammatory changes. Spleen: Normal in size without focal abnormality. Adrenals/Urinary Tract: Adrenal glands are normal. There is mild right hydronephrosis and hydroureter but no obstructing stone is seen. The bladder is unremarkable Stomach/Bowel: The stomach is nonenlarged. No dilated small bowel. Negative appendix. No bowel wall thickening Vascular/Lymphatic: No significant vascular findings are present. No enlarged abdominal or pelvic lymph nodes. Reproductive: Enlarged uterus with multiple masses. 3 cm right pelvic mass, series 2, image 65 appears to be exophytic to the lower uterine segment as suspect that right ovary is seen on image 58 of series 2 more anterior. There is a large intracavitary mass within the uterine body and fundus measuring 7 by 7 by 8 cm. Other: Negative for pelvic effusion or free air Musculoskeletal: No acute osseous abnormality IMPRESSION: 1. No CT evidence for acute intra-abdominal or pelvic abnormality 2. Mild right hydronephrosis and hydroureter but no obstructing stone is seen. 3. Enlarged uterus with multiple fibroids. Large 8 cm intracavitary appearing mass within the uterine fundus and body is indeterminate for large fibroid or endometrial mass. Gynecology consultation is recommended. Electronically Signed   By: Donavan Foil M.D.   On: 12/14/2021 19:49    Procedures Procedures    Medications Ordered in ED Medications  lactated ringers bolus 1,000 mL (1,000 mLs Intravenous New Bag/Given 12/14/21 1915)  iohexol (OMNIPAQUE) 300 MG/ML solution 100 mL (100 mLs Intravenous Contrast Given 12/14/21 1934)    ED Course/ Medical Decision Making/ A&P                           Medical Decision Making 36 year old female presents with 5 days of generalized abdominal pain with associated diarrhea and postprandial nausea and vomiting with NBNB emesis.  Hypertension:  Taking over reasons otherwise normal.  Cardiopulmonary exams are normal, abdominal exam with mild generalized tenderness palpation on exam,  worse in the upper quadrants.  GU exam deferred as patient not having any acute change in her GU symptoms.  Well-appearing.  Tolerating p.o. in the emergency department.  Amount and/or Complexity of Data Reviewed Labs: ordered.    Details: CBC with mild anemia with hemoglobin of 11 decreased from patient's baseline closer to 12.  CMP with mild hypokalemia of 3.  Otherwise unremarkable.  Lipase is normal.  RVP is negative.  UA unable to be correctly interpreted due to contamination with blood from patient's current menstrual period. Radiology: ordered.    Details: CT of the abdomen pelvis was obtained which was negative for any acute intra-abdominal or pelvic abnormality.  Visualization of the many uterine fibroids that this patient has was confirmed.  The largest appears to be an 8 cm intracavitary mass within the uterine fundus likely endometrial mass versus fibroid.  Additionally there is mild right-sided hydronephrosis and hydroureter, possibly contributed to by 3 cm right pelvic mass.  Risk Prescription drug management.   No biliary dilatation, status post cholecystectomy.   Extensive discussion with this patient regarding her CT findings.  Will refer to outpatient obstetrics and gynecology for urgent follow-up.  Regarding her more recent symptoms, clinical picture is suspicious for acute gastroenteritis.  While RVP was negative, patient may have another acute viral gastroenteritis.  Will discharge with Zofran.  Patient does endorse she is feeling much better after fluid bolus in the emergency department and is able to tolerate p.o. at this time.  The exact etiology of her abdominal pain remains unclear does not appear to be any emergent condition at this time that would require further work-up in the ER or inpatient management.   Madilyne voiced  understanding of her medical evaluation and treatment plan.  Her questions were answered to her expressed satisfaction.  Return precautions were given.  Patient is stable and was discharged in good condition.  This chart was dictated using voice recognition software, Dragon. Despite the best efforts of this provider to proofread and correct errors, errors may still occur which can change documentation meaning.    Final Clinical Impression(s) / ED Diagnoses Final diagnoses:  None    Rx / DC Orders ED Discharge Orders     None         Aura Dials 12/15/21 1343    Lacretia Leigh, MD 12/16/21 2242

## 2021-12-14 NOTE — ED Triage Notes (Signed)
Patient here POV from Home with ABD Pain.  Patient endorses ABD pain when eating since Saturday. Associated with Nausea, Emesis, and Diarrhea after eating as well.  Pain is generalized throughout. Pain is Sharp.  Gall Bladder removed approximately 2-3 years ago.  NAD Noted during Triage. A&Ox4. GCS 15. Ambulatory.

## 2022-01-11 ENCOUNTER — Encounter (HOSPITAL_COMMUNITY): Payer: Self-pay | Admitting: Radiology

## 2022-02-14 ENCOUNTER — Telehealth: Payer: Self-pay | Admitting: *Deleted

## 2022-02-14 NOTE — Telephone Encounter (Signed)
Left message for the requesting office that we are needing clarification as to the procedure to be done, type of anesthesia, name of surgeon, date of procedure. Left message to either call the office back with the needed information or re-fax new request to fax# [phone] ?

## 2022-02-15 NOTE — Telephone Encounter (Signed)
Left message to call back to schedule a tele pre op appt.  

## 2022-02-15 NOTE — Telephone Encounter (Signed)
Primary Cardiologist:Heather Renae Fickle, MD ? ?Chart reviewed as part of pre-operative protocol coverage. Because of Carly Ramos's past medical history and time since last visit, he/she will require a virtual visit/telephone call in order to better assess preoperative cardiovascular risk. ? ?Pre-op covering staff: ?- Please contact patient, obtain consent, and schedule appointment  ? ? ? ?Emmaline Life, NP-C ? ?  ?02/15/2022, 10:17 AM ?[facility] ?[address] ?Office [phone] Fax [phone] ? ?

## 2022-02-15 NOTE — Telephone Encounter (Signed)
? ?  Pre-operative Risk Assessment  ?  ?Patient Name: Carly Ramos  ?DOB: January 30, 1986 ?MRN: [id number]  ? ?  ? ?Request for Surgical Clearance   ? ?Procedure:   OPEN HYSTERECTOMY  ? ?Date of Surgery:  Clearance TBD                              ?  ?Surgeon:  DR. NOOR DASOUKI ABU-ALNADI ?Surgeon's Group or Practice Name:  [facility] ?Phone number:  [phone] ATTN: Neoma Laming PRIVETTE ?Fax number:  [phone] ?  ?Type of Clearance Requested:   ?- Medical  ?  ?Type of Anesthesia:  General  ?  ?Additional requests/questions:   ? ?Signed, ?Julaine Hua   ?02/15/2022, 9:44 AM  ? ?

## 2022-02-16 ENCOUNTER — Telehealth: Payer: Self-pay | Admitting: *Deleted

## 2022-02-16 NOTE — Telephone Encounter (Signed)
Patient returning call.

## 2022-02-16 NOTE — Telephone Encounter (Signed)
Pt agreeable to plan of care for tele pre op appt 02/18/22 @ 9:40 per pt request for time slot. Med rec and consent are done. ?

## 2022-02-16 NOTE — Telephone Encounter (Signed)
Pt agreeable to plan of care for tele pre op appt 02/18/22 @ 9:40 per pt request for time slot. Med rec and consent are done. ? ?  ?Patient Consent for Virtual Visit  ? ? ?   ? ?Carly Ramos has provided verbal consent on 02/16/2022 for a virtual visit (video or telephone). ? ? ?CONSENT FOR VIRTUAL VISIT FOR:  Carly Ramos  ?By participating in this virtual visit I agree to the following: ? ?I hereby voluntarily request, consent and authorize [facility] and its employed or contracted physicians, physician assistants, nurse practitioners or other licensed health care professionals (the Practitioner), to provide me with telemedicine health care services (the ?Services") as deemed necessary by the treating Practitioner. I acknowledge and consent to receive the Services by the Practitioner via telemedicine. I understand that the telemedicine visit will involve communicating with the Practitioner through live audiovisual communication technology and the disclosure of certain medical information by electronic transmission. I acknowledge that I have been given the opportunity to request an in-person assessment or other available alternative prior to the telemedicine visit and am voluntarily participating in the telemedicine visit. ? ?I understand that I have the right to withhold or withdraw my consent to the use of telemedicine in the course of my care at any time, without affecting my right to future care or treatment, and that the Practitioner or I may terminate the telemedicine visit at any time. I understand that I have the right to inspect all information obtained and/or recorded in the course of the telemedicine visit and may receive copies of available information for a reasonable fee.  I understand that some of the potential risks of receiving the Services via telemedicine include:  ?Delay or interruption in medical evaluation due to technological equipment failure or disruption; ?Information transmitted may  not be sufficient (e.g. poor resolution of images) to allow for appropriate medical decision making by the Practitioner; and/or  ?In rare instances, security protocols could fail, causing a breach of personal health information. ? ?Furthermore, I acknowledge that it is my responsibility to provide information about my medical history, conditions and care that is complete and accurate to the best of my ability. I acknowledge that Practitioner's advice, recommendations, and/or decision may be based on factors not within their control, such as incomplete or inaccurate data provided by me or distortions of diagnostic images or specimens that may result from electronic transmissions. I understand that the practice of medicine is not an exact science and that Practitioner makes no warranties or guarantees regarding treatment outcomes. I acknowledge that a copy of this consent can be made available to me via my patient portal ([facility]), or I can request a printed copy by calling the office of [facility].   ? ?I understand that my insurance will be billed for this visit.  ? ?I have read or had this consent read to me. ?I understand the contents of this consent, which adequately explains the benefits and risks of the Services being provided via telemedicine.  ?I have been provided ample opportunity to ask questions regarding this consent and the Services and have had my questions answered to my satisfaction. ?I give my informed consent for the services to be provided through the use of telemedicine in my medical care ? ? ? ?

## 2022-02-18 ENCOUNTER — Ambulatory Visit (INDEPENDENT_AMBULATORY_CARE_PROVIDER_SITE_OTHER): Payer: BLUE CROSS/BLUE SHIELD | Admitting: Physician Assistant

## 2022-02-18 ENCOUNTER — Encounter: Payer: Self-pay | Admitting: Physician Assistant

## 2022-02-18 DIAGNOSIS — Z01810 Encounter for preprocedural cardiovascular examination: Secondary | ICD-10-CM | POA: Diagnosis not present

## 2022-02-18 NOTE — Progress Notes (Signed)
? ?Virtual Visit via Telephone Note  ? ?This visit type was conducted due to national recommendations for restrictions regarding the COVID-19 Pandemic (e.g. social distancing) in an effort to limit this patient's exposure and mitigate transmission in our community.  Due to her co-morbid illnesses, this patient is at least at moderate risk for complications without adequate follow up.  This format is felt to be most appropriate for this patient at this time.  The patient did not have access to video technology/had technical difficulties with video requiring transitioning to audio format only (telephone).  All issues noted in this document were discussed and addressed.  No physical exam could be performed with this format.  Please refer to the patient's chart for her  consent to telehealth for [facility]. ? ?Evaluation Performed:  Preoperative cardiovascular risk assessment ?_____________  ? ?Date:  02/18/2022  ? ?Patient ID:  Carly Ramos, DOB 08-15-1986, MRN [id number] ?Patient Location:  ?Home ?Provider location:   ?Office ? ?Primary Care Provider:  Gustavus Bryant, PA-C ?Primary Cardiologist:  Freada Bergeron, MD ? ?Chief Complaint  ?  ?36 y.o. y/o female with a h/o HTN, PAF, OSA, and DM2, who is pending OPEN HYSTERECTOMY, and presents today for telephonic preoperative cardiovascular risk assessment. ? ?Past Medical History  ?  ?Past Medical History:  ?Diagnosis Date  ? A-fib ([place])   ? COVID   ? Diabetes ([place])   ? Dyspnea on exertion   ? Fibroids   ? Hypertension   ? Hypokalemia   ? OSA (obstructive sleep apnea)   ? Racing heart beat   ? SOB (shortness of breath) on exertion   ? ?Past Surgical History:  ?Procedure Laterality Date  ? CHOLECYSTECTOMY N/A 06/10/2020  ? Procedure: LAPAROSCOPIC CHOLECYSTECTOMY WITH ATTEMPTED INTRAOPERATIVE CHOLANGIOGRAM;  Surgeon: Greer Pickerel, MD;  Location: [facility];  Service: General;  Laterality: N/A;  ? ? ?Allergies ? ?Allergies  ?Allergen Reactions  ? Penicillins Swelling  ?   Did it involve swelling of the face/tongue/throat, SOB, or low BP? Yes ?Did it involve sudden or severe rash/hives, skin peeling, or any reaction on the inside of your mouth or nose? No ?Did you need to seek medical attention at a hospital or doctor's office? Yes ?When did it last happen? childhood  ?If all above answers are ?NO?, may proceed with cephalosporin use. ?  ? ? ?History of Present Illness  ?  ?Carly Ramos is a 36 y.o. female who presents via Engineer, civil (consulting) for a telehealth visit today.  Pt was last seen in cardiology clinic on 11/09/21 by Dr. Johney Frame.  At that time Carly Ramos was doing well, but reported increased palpitations. She is not anticoagulated due to a low CHA2DS2-VASc score.  Repeat heart monitor February 2023 showed no evidence of atrial fibrillation or significant arrhythmias.  Patient triggered events were associated with faster heart rates but sinus rhythm.  The patient is now pending OPEN HYSTERECTOMY.  Since her last visit, she has done well. She is active with her 55 yo son and can complete more than 4.0 METS without angina. She has no history of ischemic heart disease or stroke.  ? ? ?Home Medications  ?  ?Prior to Admission medications   ?Medication Sig Start Date End Date Taking? Authorizing Provider  ?acetaminophen (TYLENOL) 500 MG tablet Take 500 mg by mouth every 6 (six) hours as needed for mild pain or moderate pain.    [provider]  ?amLODipine (NORVASC) 10 MG tablet Take 10 mg by  mouth daily. 11/05/18   [provider]  ?aspirin 81 MG EC tablet Take 1 tablet by mouth daily. 03/06/20   [provider]  ?clindamycin (CLEOCIN) 150 MG capsule Take 2 capsules (300 mg total) by mouth 3 (three) times daily. May dispense as '150mg'$  capsules ?Patient not taking: Reported on 11/09/2021 09/17/20   Montine Circle, PA-C  ?ferrous sulfate 325 (65 FE) MG tablet Take 325 mg by mouth daily with breakfast.    [provider]  ?ibuprofen (ADVIL)  200 MG tablet Take 2 tablets (400 mg total) by mouth every 8 (eight) hours as needed for mild pain or moderate pain. 06/11/20   Jill Alexanders, PA-C  ?magnesium gluconate (MAGONATE) 500 MG tablet Take 500 mg by mouth daily.    [provider]  ?metoprolol succinate (TOPROL XL) 25 MG 24 hr tablet Take 1 tablet (25 mg total) by mouth daily. 11/09/21   Freada Bergeron, MD  ?Multiple Vitamins-Minerals (MULTIVITAMIN ADULT PO) Take 1 tablet by mouth daily. gummies    [provider]  ?ondansetron (ZOFRAN-ODT) 4 MG disintegrating tablet Take 1 tablet (4 mg total) by mouth every 8 (eight) hours as needed for nausea or vomiting. ?Patient not taking: Reported on 02/16/2022 12/14/21   Sponseller, Gypsy Balsam, PA-C  ?oxyCODONE (OXY IR/ROXICODONE) 5 MG immediate release tablet Take 1 tablet (5 mg total) by mouth every 6 (six) hours as needed for severe pain (not releived by tylenol or ibuprofen). ?Patient not taking: Reported on 11/09/2021 06/11/20   Jill Alexanders, PA-C  ?Potassium 99 MG TABS Take 1 tablet by mouth daily.    [provider]  ? ? ?Physical Exam  ?  ?Vital Signs:  Ladaija Dimino does not have vital signs available for review today. ? ?Given telephonic nature of communication, physical exam is limited. ?AAOx3. NAD. Normal affect.  Speech and respirations are unlabored. ? ?Accessory Clinical Findings  ?  ?None ? ?Assessment & Plan  ?  ?1.  Preoperative Cardiovascular Risk Assessment: ? ?She is able to complete 4.0 METS without angina.  She does not have a history of ischemic heart disease, stroke, or insulin-dependent diabetes.  According to the RCRI, she has a 0.4% risk of [place] during the perioperative period.  Her biggest risk factor is likely recurrence of A-fib with or without RVR.  I have recommended that she continue her beta-blocker throughout the perioperative period. ? ?Therefore, based on ACC/AHA guidelines, the patient would be at acceptable risk for the planned procedure  without further cardiovascular testing.  ? ?The patient was advised that if she develops new symptoms prior to surgery to contact our office to arrange for a follow-up visit, and she verbalized understanding. ? ? ?A copy of this note will be routed to requesting surgeon. ? ?Time:   ?Today, I have spent 8 minutes with the patient with telehealth technology discussing medical history, symptoms, and management plan.   ? ? ?Ledora Bottcher, PA ? ?02/18/2022, 9:27 AM ?

## 2022-03-07 ENCOUNTER — Telehealth: Payer: Self-pay | Admitting: Cardiology

## 2022-03-07 MED ORDER — AMLODIPINE BESYLATE 10 MG PO TABS: 10.0000 mg | ORAL_TABLET | Freq: Every day | ORAL | 3 refills | Status: DC

## 2022-03-07 NOTE — Telephone Encounter (Signed)
Pt's medication was sent to pt's pharmacy as requested. Confirmation received.  °

## 2022-03-07 NOTE — Telephone Encounter (Signed)
?*  STAT* If patient is at the pharmacy, call can be transferred to refill team. ? ? ?1. Which medications need to be refilled? (please list name of each medication and dose if known) amLODipine (NORVASC) 10 MG tablet ? ?2. Which pharmacy/location (including street and city if local pharmacy) is medication to be sent to? [id number] - [place], [place] ? ?3. Do they need a 30 day or 90 day supply? 90 day ? ? ?Patient is completely out of medication ?

## 2022-04-13 ENCOUNTER — Emergency Department (HOSPITAL_BASED_OUTPATIENT_CLINIC_OR_DEPARTMENT_OTHER)
Admission: EM | Admit: 2022-04-13 | Discharge: 2022-04-13 | Disposition: A | Discharge status: Home / Self Care | Payer: BLUE CROSS/BLUE SHIELD | Attending: Emergency Medicine | Admitting: Emergency Medicine

## 2022-04-13 ENCOUNTER — Other Ambulatory Visit: Payer: Self-pay

## 2022-04-13 ENCOUNTER — Encounter (HOSPITAL_BASED_OUTPATIENT_CLINIC_OR_DEPARTMENT_OTHER): Payer: Self-pay

## 2022-04-13 DIAGNOSIS — Z7982 Long term (current) use of aspirin: Secondary | ICD-10-CM | POA: Diagnosis not present

## 2022-04-13 DIAGNOSIS — D500 Iron deficiency anemia secondary to blood loss (chronic): Secondary | ICD-10-CM | POA: Insufficient documentation

## 2022-04-13 DIAGNOSIS — R002 Palpitations: Secondary | ICD-10-CM | POA: Diagnosis present

## 2022-04-13 DIAGNOSIS — E876 Hypokalemia: Secondary | ICD-10-CM | POA: Diagnosis not present

## 2022-04-13 LAB — CBC
HCT: 26.9 % — ABNORMAL LOW (ref 36.0–46.0)
Hemoglobin: 8.4 g/dL — ABNORMAL LOW (ref 12.0–15.0)
MCH: 25.9 pg — ABNORMAL LOW (ref 26.0–34.0)
MCHC: 31.2 g/dL (ref 30.0–36.0)
MCV: 83 fL (ref 80.0–100.0)
Platelets: 351 10*3/uL (ref 150–400)
RBC: 3.24 MIL/uL — ABNORMAL LOW (ref 3.87–5.11)
RDW: 14.5 % (ref 11.5–15.5)
WBC: 5.4 10*3/uL (ref 4.0–10.5)
nRBC: 0 % (ref 0.0–0.2)

## 2022-04-13 LAB — BASIC METABOLIC PANEL
Anion gap: 8 (ref 5–15)
BUN: 7 mg/dL (ref 6–20)
CO2: 24 mmol/L (ref 22–32)
Calcium: 9 mg/dL (ref 8.9–10.3)
Chloride: 107 mmol/L (ref 98–111)
Creatinine, Ser: 0.61 mg/dL (ref 0.44–1.00)
GFR, Estimated: >60 mL/min (ref >60)
Glucose, Bld: 182 mg/dL — ABNORMAL HIGH (ref 70–99)
Potassium: 3 mmol/L — ABNORMAL LOW (ref 3.5–5.1)
Sodium: 139 mmol/L (ref 135–145)

## 2022-04-13 LAB — MAGNESIUM: Magnesium: 1.8 mg/dL (ref 1.7–2.4)

## 2022-04-13 MED ORDER — SODIUM CHLORIDE 0.9 % IV BOLUS
500.0000 mL | Freq: Once | INTRAVENOUS | Status: AC
  Administered 2022-04-13: 500 mL via INTRAVENOUS

## 2022-04-13 MED ORDER — POTASSIUM CHLORIDE CRYS ER 20 MEQ PO TBCR
40.0000 meq | EXTENDED_RELEASE_TABLET | Freq: Once | ORAL | Status: AC
  Administered 2022-04-13: 40 meq via ORAL
  Filled 2022-04-13: qty 2

## 2022-04-13 NOTE — ED Triage Notes (Signed)
Pt presents to the ED after being woken from her sleep with nausea, palpitations, sore throat, and SHOB. States that she had one episode of vomiting. Has a hx of afib. States that she takes metoprolol daily. Pt A&Ox4 at time of triage. EKG obtained

## 2022-04-13 NOTE — ED Provider Notes (Signed)
[facility] EMERGENCY DEPT Provider Note   CSN: [id number] Arrival date & time: 04/13/22  0346     History  Chief Complaint  Patient presents with   Palpitations    Carly Ramos is a 36 y.o. female.  Patient presents to the emergency department for evaluation of palpitations.  Patient concerned because she does have a history of atrial fibrillation.  She reports that she was having irregular heart, racing heartbeat that felt like the original A-fib diagnosis.  She does have a history of hypokalemia, took a potassium tablet at home before coming to the ER.      Home Medications Prior to Admission medications   Medication Sig Start Date End Date Taking? Authorizing Provider  acetaminophen (TYLENOL) 500 MG tablet Take 500 mg by mouth every 6 (six) hours as needed for mild pain or moderate pain.    [provider]  amLODipine (NORVASC) 10 MG tablet Take 1 tablet (10 mg total) by mouth daily. 03/07/22   Freada Bergeron, MD  aspirin 81 MG EC tablet Take 1 tablet by mouth daily. 03/06/20   [provider]  clindamycin (CLEOCIN) 150 MG capsule Take 2 capsules (300 mg total) by mouth 3 (three) times daily. May dispense as '150mg'$  capsules Patient not taking: Reported on 11/09/2021 09/17/20   Montine Circle, PA-C  ferrous sulfate 325 (65 FE) MG tablet Take 325 mg by mouth daily with breakfast.    [provider]  ibuprofen (ADVIL) 200 MG tablet Take 2 tablets (400 mg total) by mouth every 8 (eight) hours as needed for mild pain or moderate pain. 06/11/20   Jill Alexanders, PA-C  magnesium gluconate (MAGONATE) 500 MG tablet Take 500 mg by mouth daily.    [provider]  metoprolol succinate (TOPROL XL) 25 MG 24 hr tablet Take 1 tablet (25 mg total) by mouth daily. 11/09/21   Freada Bergeron, MD  Multiple Vitamins-Minerals (MULTIVITAMIN ADULT PO) Take 1 tablet by mouth daily. gummies    [provider]  ondansetron (ZOFRAN-ODT) 4  MG disintegrating tablet Take 1 tablet (4 mg total) by mouth every 8 (eight) hours as needed for nausea or vomiting. Patient not taking: Reported on 02/16/2022 12/14/21   Sponseller, Eugene Garnet R, PA-C  oxyCODONE (OXY IR/ROXICODONE) 5 MG immediate release tablet Take 1 tablet (5 mg total) by mouth every 6 (six) hours as needed for severe pain (not releived by tylenol or ibuprofen). Patient not taking: Reported on 11/09/2021 06/11/20   Jill Alexanders, PA-C  Potassium 99 MG TABS Take 1 tablet by mouth daily.    [provider]      Allergies    Penicillins    Review of Systems   Review of Systems  Physical Exam Updated Vital Signs BP 120/75   Pulse 66   Temp 98.2 F (36.8 C) (Oral)   Resp 20   Ht '5\' 3"'$  (1.6 m)   Wt 79.4 kg   LMP 04/01/2022   SpO2 100%   BMI 31.00 kg/m  Physical Exam Vitals and nursing note reviewed.  Constitutional:      General: She is not in acute distress.    Appearance: She is well-developed.  HENT:     Head: Normocephalic and atraumatic.     Mouth/Throat:     Mouth: Mucous membranes are moist.  Eyes:     General: Vision grossly intact. Gaze aligned appropriately.     Extraocular Movements: Extraocular movements intact.     Conjunctiva/sclera: Conjunctivae normal.  Cardiovascular:     Rate and Rhythm: Normal rate and regular rhythm.     Pulses: Normal pulses.     Heart sounds: Normal heart sounds, S1 normal and S2 normal. No murmur heard.   No friction rub. No gallop.  Pulmonary:     Effort: Pulmonary effort is normal. No respiratory distress.     Breath sounds: Normal breath sounds.  Abdominal:     General: Bowel sounds are normal.     Palpations: Abdomen is soft.     Tenderness: There is no abdominal tenderness. There is no guarding or rebound.     Hernia: No hernia is present.  Musculoskeletal:        General: No swelling.     Cervical back: Full passive range of motion without pain, normal range of motion and neck supple. No spinous  process tenderness or muscular tenderness. Normal range of motion.     Right lower leg: No edema.     Left lower leg: No edema.  Skin:    General: Skin is warm and dry.     Capillary Refill: Capillary refill takes less than 2 seconds.     Findings: No ecchymosis, erythema, rash or wound.  Neurological:     General: No focal deficit present.     Mental Status: She is alert and oriented to person, place, and time.     GCS: GCS eye subscore is 4. GCS verbal subscore is 5. GCS motor subscore is 6.     Cranial Nerves: Cranial nerves 2-12 are intact.     Sensory: Sensation is intact.     Motor: Motor function is intact.     Coordination: Coordination is intact.  Psychiatric:        Attention and Perception: Attention normal.        Mood and Affect: Mood normal.        Speech: Speech normal.        Behavior: Behavior normal.    ED Results / Procedures / Treatments   Labs (all labs ordered are listed, but only abnormal results are displayed) Labs Reviewed  CBC - Abnormal; Notable for the following components:      Result Value   RBC 3.24 (*)    Hemoglobin 8.4 (*)    HCT 26.9 (*)    MCH 25.9 (*)    All other components within normal limits  BASIC METABOLIC PANEL - Abnormal; Notable for the following components:   Potassium 3.0 (*)    Glucose, Bld 182 (*)    All other components within normal limits  MAGNESIUM    EKG EKG Interpretation  Date/Time:  Wednesday April 13 2022 03:54:44 EDT Ventricular Rate:  75 PR Interval:  198 QRS Duration: 84 QT Interval:  374 QTC Calculation: 417 R Axis:   53 Text Interpretation: Sinus rhythm with marked sinus arrhythmia Otherwise normal ECG Confirmed by Orpah Greek [id number] on 04/13/2022 4:05:49 AM  Radiology No results found.  Procedures Procedures    Medications Ordered in ED Medications  potassium chloride SA (KLOR-CON M) CR tablet 40 mEq (has no administration in time range)  sodium chloride 0.9 % bolus 500 mL (500 mLs  Intravenous New Bag/Given 04/13/22 0420)    ED Course/ Medical Decision Making/ A&P                           Medical Decision Making Amount and/or Complexity of Data Reviewed External Data Reviewed: labs, radiology,  ECG and notes.    Details: Prior cardiology notes reviewed.  Patient had long-term outpatient monitoring in February of this year which showed sinus tachycardia, no recurrent A-fib. Labs: ordered. Decision-making details documented in ED Course. ECG/medicine tests: ordered and independent interpretation performed. Decision-making details documented in ED Course.  Risk Prescription drug management.   Patient presents to the emergency department for evaluation of heart palpitations, fast heartbeat with shortness of breath.  Patient concerned about possible recurrent A-fib.  Records review indicates that she did have an episode of A-fib approximately 3 years ago.  She is not currently on anticoagulation.  She has followed up multiple times with cardiology since with recurrent symptoms.  She had outpatient cardiac monitoring in February of this year which showed tachycardia that coincided with her symptoms but no recurrence of A-fib.  She was not recommended to go back on blood thinner.  EKG at arrival shows sinus rhythm.  She was monitored in the department today in no evidence of recurrent arrhythmia noted.  She does have a history of anemia and hypokalemia.  Labs performed today.  She does have mild hypokalemia.  Given additional potassium here in the ED.  Additionally, she is anemic.  She is currently menstruating and reports that her hemoglobin does drop when she has her cycle.  She is scheduled for OB/GYN procedure in the coming weeks to address this.  It appears that her palpitations are likely multifactorial.  There has not been any evidence that she is experiencing recurrent A-fib.  We will refer back to her cardiologist for further consideration.  I do not see any reason to put  her on anticoagulation.  She can restart iron supplementation and follow-up with OB/GYN as scheduled.        Final Clinical Impression(s) / ED Diagnoses Final diagnoses:  Palpitations  Hypokalemia  Iron deficiency anemia due to chronic blood loss    Rx / DC Orders ED Discharge Orders     None         Orpah Greek, MD 04/13/22 [phone]

## 2022-04-13 NOTE — ED Notes (Signed)
Pt verbalizes understanding of discharge instructions. Opportunity for questioning and answers were provided. Pt discharged from ED to home with significant other.   ? ?

## 2022-09-02 ENCOUNTER — Ambulatory Visit: Payer: BLUE CROSS/BLUE SHIELD | Admitting: Physician Assistant

## 2022-09-22 NOTE — Progress Notes (Unsigned)
Office Visit    Patient Name: Carly Ramos Date of Encounter: 09/22/2022  PCP:  Nall, Jennifer L, [place]  Cardiologist:  Freada Bergeron, MD  Advanced Practice Provider:  No care team member to display Electrophysiologist:  None   HPI    Carly Ramos is a 36 y.o. female with a past medical history significant for hypertension, PAF, OSA, DM 2 presents today for 52-monthfollow-up visit.  She was last seen 02/2022 for preop clearance.  She was having an open hysterectomy.  She was having increased palpitations at that time and was not on any anticoagulation due to a low CHA2DS2-VASc score.  A heart monitor was placed February 2023 which showed no evidence of atrial fibrillation or any significant arrhythmias.  She was ultimately cleared for surgery.  Today, she ***  Past Medical History    Past Medical History:  Diagnosis Date   A-fib ([place]    COVID    Diabetes ([place]    Dyspnea on exertion    Fibroids    Hypertension    Hypokalemia    OSA (obstructive sleep apnea)    Racing heart beat    SOB (shortness of breath) on exertion    Past Surgical History:  Procedure Laterality Date   CHOLECYSTECTOMY N/A 06/10/2020   Procedure: LAPAROSCOPIC CHOLECYSTECTOMY WITH ATTEMPTED INTRAOPERATIVE CHOLANGIOGRAM;  Surgeon: WGreer Pickerel MD;  [facility] [facility]  Service: General;  Laterality: N/A;    Allergies  Allergies  Allergen Reactions   Penicillins Swelling    Did it involve swelling of the face/tongue/throat, SOB, or low BP? Yes Did it involve sudden or severe rash/hives, skin peeling, or any reaction on the inside of your mouth or nose? No Did you need to seek medical attention at a hospital or doctor's office? Yes When did it last happen? childhood  If all above answers are "NO", may proceed with cephalosporin use.     EKGs/Labs/Other Studies Reviewed:   The following studies were reviewed today:  Echocardiogram  12/2021  IMPRESSIONS     1. Left ventricular ejection fraction, by estimation, is 60 to 65%. The  left ventricle has normal function. The left ventricle has no regional  wall motion abnormalities. Left ventricular diastolic parameters were  normal.   2. Right ventricular systolic function is normal. The right ventricular  size is normal. Tricuspid regurgitation signal is inadequate for assessing  PA pressure.   3. The mitral valve is normal in structure. Trivial mitral valve  regurgitation. No evidence of mitral stenosis.   4. The aortic valve is normal in structure. Aortic valve regurgitation is  not visualized. No aortic stenosis is present.   5. The inferior vena cava is normal in size with greater than 50%  respiratory variability, suggesting right atrial pressure of 3 mmHg.    Monitor 12/2021  Patch wear time was 13 days and 17 hours Predominant rhythm was NSR with average HR 79bpm (ranging from 42-165bpm) She had one 3 second sinus pause with sleep Patient triggered events correlated with sinus rhythm/sinus tachycardia Rare SVE and VE (<1%) No sustained arrhythmias     Patch Wear Time:  13 days and 17 hours (2023-01-16T19:40:43-0500 to 2023-01-30T12:50:55-0500)   Patient had a min HR of 42 bpm, max HR of 165 bpm, and avg HR of 79 bpm. Predominant underlying rhythm was Sinus Rhythm. 1 Pause occurred lasting 3 secs (20 bpm). Isolated SVEs were rare (<1.0%), SVE Couplets were rare (<1.0%), and no  SVE Triplets were  present. Isolated VEs were rare (<1.0%), and no VE Couplets or VE Triplets were present.    Carly Kaufman, MD  EKG:  EKG is *** ordered today.  The ekg ordered today demonstrates ***  Recent Labs: 12/14/2021: ALT 11 04/13/2022: BUN 7; Creatinine, Ser 0.61; Hemoglobin 8.4; Magnesium 1.8; Platelets 351; Potassium 3.0; Sodium 139  Recent Lipid Panel    Component Value Date/Time   CHOL 109 06/09/2020 0609   TRIG 46 06/09/2020 0609   HDL 42 06/09/2020 0609    CHOLHDL 2.6 06/09/2020 0609   VLDL 9 06/09/2020 0609   LDLCALC 58 06/09/2020 0609    Risk Assessment/Calculations:  {Does this patient have ATRIAL FIBRILLATION?[phone]}  Home Medications   No outpatient medications have been marked as taking for the 09/23/22 encounter (Appointment) with Elgie Collard, PA-C.     Review of Systems   ***   All other systems reviewed and are otherwise negative except as noted above.  Physical Exam    VS:  There were no vitals taken for this visit. , BMI There is no height or weight on file to calculate BMI.  Wt Readings from Last 3 Encounters:  04/13/22 175 lb (79.4 kg)  12/14/21 167 lb 12.3 oz (76.1 kg)  11/09/21 167 lb 12.8 oz (76.1 kg)     GEN: Well nourished, well developed, in no acute distress. HEENT: normal. Neck: Supple, no JVD, carotid bruits, or masses. Cardiac: ***RRR, no murmurs, rubs, or gallops. No clubbing, cyanosis, edema.  ***Radials/PT 2+ and equal bilaterally.  Respiratory:  ***Respirations regular and unlabored, clear to auscultation bilaterally. GI: Soft, nontender, nondistended. MS: No deformity or atrophy. Skin: Warm and dry, no rash. Neuro:  Strength and sensation are intact. Psych: Normal affect.  Assessment & Plan    Atrial fibrillation Hypertension DOE  No BP recorded.  {Refresh Note OR Click here to enter BP  :1}***      Disposition: Follow up {follow up:15908} with Freada Bergeron, MD or APP.  Signed, Elgie Collard, PA-C 09/22/2022, 4:35 PM [facility]

## 2022-09-23 ENCOUNTER — Ambulatory Visit: Payer: BLUE CROSS/BLUE SHIELD | Admitting: Physician Assistant

## 2022-09-23 DIAGNOSIS — I10 Essential (primary) hypertension: Secondary | ICD-10-CM

## 2022-09-23 DIAGNOSIS — I480 Paroxysmal atrial fibrillation: Secondary | ICD-10-CM

## 2022-09-23 DIAGNOSIS — R0609 Other forms of dyspnea: Secondary | ICD-10-CM

## 2022-10-11 ENCOUNTER — Encounter: Payer: Self-pay | Admitting: Physician Assistant

## 2022-10-11 ENCOUNTER — Ambulatory Visit: Payer: BLUE CROSS/BLUE SHIELD | Attending: Physician Assistant | Admitting: Physician Assistant

## 2022-10-11 VITALS — BP 124/80 | HR 86 | Ht 63.0 in | Wt 175.8 lb

## 2022-10-11 DIAGNOSIS — I480 Paroxysmal atrial fibrillation: Secondary | ICD-10-CM | POA: Diagnosis not present

## 2022-10-11 DIAGNOSIS — R0609 Other forms of dyspnea: Secondary | ICD-10-CM

## 2022-10-11 DIAGNOSIS — I10 Essential (primary) hypertension: Secondary | ICD-10-CM

## 2022-10-11 MED ORDER — METOPROLOL SUCCINATE ER 25 MG PO TB24: 25.0000 mg | ORAL_TABLET | Freq: Every day | ORAL | 3 refills | Status: DC

## 2022-10-11 NOTE — Progress Notes (Signed)
Office Visit    Patient Name: Carly Ramos Date of Encounter: 10/11/2022  PCP:  Nall, Jennifer L, [place]  Cardiologist:  Freada Bergeron, MD  Advanced Practice Provider:  No care team member to display Electrophysiologist:  None   HPI    Carly Ramos is a 36 y.o. female with a past medical history significant for hypertension, PAF, OSA, DM 2 presents today for 39-monthfollow-up visit.  She was last seen 02/2022 for preop clearance.  She was having an open hysterectomy.  She was having increased palpitations at that time and was not on any anticoagulation due to a low CHA2DS2-VASc score.  A heart monitor was placed February 2023 which showed no evidence of atrial fibrillation or any significant arrhythmias.  She was ultimately cleared for surgery.  Today, she states that her heart was beating really fast and with activity it gets worse.  She went back to eating bacon which she has not eaten in years and thought this could be contributing.  She took some magnesium and potassium and her palpitations seem to have subsided.  She did not take her heart medication today because she ran out of refills.  She states that her rhythm gets worse when she is physically upset.  She states sometimes it feels like she is having a hot flash at the same time.  She did recently have a hysterectomy so she no longer has a menstrual period.   Reports no shortness of breath nor dyspnea on exertion. Reports no chest pain, pressure, or tightness. No edema, orthopnea, PND.   Past Medical History    Past Medical History:  Diagnosis Date   [place]    COVID    Diabetes ([place]    Dyspnea on exertion    Fibroids    Hypertension    Hypokalemia    OSA (obstructive sleep apnea)    Racing heart beat    SOB (shortness of breath) on exertion    Past Surgical History:  Procedure Laterality Date   CHOLECYSTECTOMY N/A 06/10/2020   Procedure: LAPAROSCOPIC CHOLECYSTECTOMY  WITH ATTEMPTED INTRAOPERATIVE CHOLANGIOGRAM;  Surgeon: WGreer Pickerel MD;  [facility]  Service: General;  Laterality: N/A;    Allergies  Allergies  Allergen Reactions   Penicillins Swelling    Did it involve swelling of the face/tongue/throat, SOB, or low BP? Yes Did it involve sudden or severe rash/hives, skin peeling, or any reaction on the inside of your mouth or nose? No Did you need to seek medical attention at a hospital or doctor's office? Yes When did it last happen? childhood  If all above answers are "NO", may proceed with cephalosporin use.     EKGs/Labs/Other Studies Reviewed:   The following studies were reviewed today:  Echocardiogram 12/2021  IMPRESSIONS     1. Left ventricular ejection fraction, by estimation, is 60 to 65%. The  left ventricle has normal function. The left ventricle has no regional  wall motion abnormalities. Left ventricular diastolic parameters were  normal.   2. Right ventricular systolic function is normal. The right ventricular  size is normal. Tricuspid regurgitation signal is inadequate for assessing  PA pressure.   3. The mitral valve is normal in structure. Trivial mitral valve  regurgitation. No evidence of mitral stenosis.   4. The aortic valve is normal in structure. Aortic valve regurgitation is  not visualized. No aortic stenosis is present.   5. The inferior vena cava is normal  in size with greater than 50%  respiratory variability, suggesting right atrial pressure of 3 mmHg.    Monitor 12/2021  Patch wear time was 13 days and 17 hours Predominant rhythm was NSR with average HR 79bpm (ranging from 42-165bpm) She had one 3 second sinus pause with sleep Patient triggered events correlated with sinus rhythm/sinus tachycardia Rare SVE and VE (<1%) No sustained arrhythmias     Patch Wear Time:  13 days and 17 hours (2023-01-16T19:40:43-0500 to 2023-01-30T12:50:55-0500)   Patient had a min HR of 42 bpm, max HR of 165  bpm, and avg HR of 79 bpm. Predominant underlying rhythm was Sinus Rhythm. 1 Pause occurred lasting 3 secs (20 bpm). Isolated SVEs were rare (<1.0%), SVE Couplets were rare (<1.0%), and no SVE Triplets were  present. Isolated VEs were rare (<1.0%), and no VE Couplets or VE Triplets were present.    Gwyndolyn Kaufman, MD  EKG:  EKG is not ordered today.   Recent Labs: 12/14/2021: ALT 11 04/13/2022: BUN 7; Creatinine, Ser 0.61; Hemoglobin 8.4; Magnesium 1.8; Platelets 351; Potassium 3.0; Sodium 139  Recent Lipid Panel    Component Value Date/Time   CHOL 109 06/09/2020 0609   TRIG 46 06/09/2020 0609   HDL 42 06/09/2020 0609   CHOLHDL 2.6 06/09/2020 0609   VLDL 9 06/09/2020 0609   LDLCALC 58 06/09/2020 0609    Risk Assessment/Calculations:   CHA2DS2-VASc Score = 2   This indicates a 2.2% annual risk of stroke. The patient's score is based upon: CHF History: 0 HTN History: 1 Diabetes History: 0 Stroke History: 0 Vascular Disease History: 0 Age Score: 0 Gender Score: 1      Home Medications   Current Meds  Medication Sig   amLODipine (NORVASC) 10 MG tablet Take 1 tablet (10 mg total) by mouth daily.   aspirin 81 MG EC tablet Take 1 tablet by mouth daily.   magnesium gluconate (MAGONATE) 500 MG tablet Take 500 mg by mouth daily.   Multiple Vitamins-Minerals (MULTIVITAMIN ADULT PO) Take 1 tablet by mouth daily. gummies   ondansetron (ZOFRAN-ODT) 4 MG disintegrating tablet Take 1 tablet (4 mg total) by mouth every 8 (eight) hours as needed for nausea or vomiting.   Potassium 99 MG TABS Take 1 tablet by mouth daily.   [DISCONTINUED] metoprolol succinate (TOPROL XL) 25 MG 24 hr tablet Take 1 tablet (25 mg total) by mouth daily.     Review of Systems      All other systems reviewed and are otherwise negative except as noted above.  Physical Exam    VS:  BP 124/80   Pulse 86   Ht '5\' 3"'$  (1.6 m)   Wt 175 lb 12.8 oz (79.7 kg)   SpO2 98%   BMI 31.14 kg/m  , BMI Body mass index  is 31.14 kg/m.  Wt Readings from Last 3 Encounters:  10/11/22 175 lb 12.8 oz (79.7 kg)  04/13/22 175 lb (79.4 kg)  12/14/21 167 lb 12.3 oz (76.1 kg)     GEN: Well nourished, well developed, in no acute distress. HEENT: normal. Neck: Supple, no JVD, carotid bruits, or masses. Cardiac: RRR, no murmurs, rubs, or gallops. No clubbing, cyanosis, edema.  Radials/PT 2+ and equal bilaterally.  Respiratory:  Respirations regular and unlabored, clear to auscultation bilaterally. GI: Soft, nontender, nondistended. MS: No deformity or atrophy. Skin: Warm and dry, no rash. Neuro:  Strength and sensation are intact. Psych: Normal affect.  Assessment & Plan    Atrial fibrillation/sinus tachycardia/palpitations -  none on recent monitor (Feb 2023) -not started on [facility] due to low CHA2DS2-VASc score -Increased palpitations especially when she is physically upset with hot flashes -We will check TSH, BMP, CBC, and magnesium -We will ask you to track heart rate and blood pressure and increase metoprolol if needed -Echocardiogram from 2/23 reviewed  Hypertension -well controlled today -continue Norvasc '10mg'$  -If we titrate her metoprolol we may need to cut her down to '5mg'$  -continue current dosage for now          Disposition: Follow up 1 month with Freada Bergeron, MD or APP.  Signed, Elgie Collard, PA-C 10/11/2022, 4:07 PM [facility]

## 2022-10-11 NOTE — Patient Instructions (Signed)
Medication Instructions:  Your physician recommends that you continue on your current medications as directed. Please refer to the Current Medication list given to you today.  *If you need a refill on your cardiac medications before your next appointment, please call your pharmacy*   Lab Work: BMP, TSH, CBC and MAG today If you have labs (blood work) drawn today and your tests are completely normal, you will receive your results only by: [place] (if you have MyChart) OR A paper copy in the mail If you have any lab test that is abnormal or we need to change your treatment, we will call you to review the results.   Follow-Up: At [facility], you and your health needs are our priority.  As part of our continuing mission to provide you with exceptional heart care, we have created designated Provider Care Teams.  These Care Teams include your primary Cardiologist (physician) and Advanced Practice Providers (APPs -  Physician Assistants and Nurse Practitioners) who all work together to provide you with the care you need, when you need it.  Your next appointment:   1 month(s)  The format for your next appointment:   In Person  Provider:   Freada Bergeron, MD  or Nicholes Rough, PA-C       Other Instructions Omron is the recommended brand of blood pressure machine to purchase as discussed today. Check your blood pressure daily one hour after taking your morning medications for the next 2 weeks, keep a log and send [place] the readings through mychart.  Important Information About [place]

## 2022-10-12 LAB — CBC
Hematocrit: 39.4 % (ref 34.0–46.6)
Hemoglobin: 12.9 g/dL (ref 11.1–15.9)
MCH: 26.7 pg (ref 26.6–33.0)
MCHC: 32.7 g/dL (ref 31.5–35.7)
MCV: 81 fL (ref 79–97)
Platelets: 304 10*3/uL (ref 150–450)
RBC: 4.84 x10E6/uL (ref 3.77–5.28)
RDW: 17.3 % — ABNORMAL HIGH (ref 11.7–15.4)
WBC: 7.8 10*3/uL (ref 3.4–10.8)

## 2022-10-12 LAB — BASIC METABOLIC PANEL
BUN/Creatinine Ratio: 9 (ref 9–23)
BUN: 6 mg/dL (ref 6–20)
CO2: 21 mmol/L (ref 20–29)
Calcium: 9.6 mg/dL (ref 8.7–10.2)
Chloride: 102 mmol/L (ref 96–106)
Creatinine, Ser: 0.68 mg/dL (ref 0.57–1.00)
Glucose: 90 mg/dL (ref 70–99)
Potassium: 3.3 mmol/L — ABNORMAL LOW (ref 3.5–5.2)
Sodium: 140 mmol/L (ref 134–144)
eGFR: 116 mL/min/{1.73_m2} (ref >59)

## 2022-10-12 LAB — MAGNESIUM: Magnesium: 2.2 mg/dL (ref 1.6–2.3)

## 2022-10-12 LAB — TSH: TSH: 1.57 u[IU]/mL (ref 0.450–4.500)

## 2022-10-13 DIAGNOSIS — E876 Hypokalemia: Secondary | ICD-10-CM

## 2022-10-13 DIAGNOSIS — R0609 Other forms of dyspnea: Secondary | ICD-10-CM

## 2022-10-13 DIAGNOSIS — I10 Essential (primary) hypertension: Secondary | ICD-10-CM

## 2022-10-13 MED ORDER — POTASSIUM CHLORIDE CRYS ER 20 MEQ PO TBCR: 20.0000 meq | EXTENDED_RELEASE_TABLET | Freq: Every day | ORAL | 3 refills | Status: DC

## 2022-10-21 ENCOUNTER — Ambulatory Visit: Payer: BLUE CROSS/BLUE SHIELD | Attending: Physician Assistant

## 2022-10-21 DIAGNOSIS — R0609 Other forms of dyspnea: Secondary | ICD-10-CM

## 2022-10-21 DIAGNOSIS — E876 Hypokalemia: Secondary | ICD-10-CM

## 2022-10-21 DIAGNOSIS — I10 Essential (primary) hypertension: Secondary | ICD-10-CM

## 2022-10-22 LAB — BASIC METABOLIC PANEL
BUN/Creatinine Ratio: 9 (ref 9–23)
BUN: 6 mg/dL (ref 6–20)
CO2: 23 mmol/L (ref 20–29)
Calcium: 9.5 mg/dL (ref 8.7–10.2)
Chloride: 105 mmol/L (ref 96–106)
Creatinine, Ser: 0.69 mg/dL (ref 0.57–1.00)
Glucose: 64 mg/dL — ABNORMAL LOW (ref 70–99)
Potassium: 3.3 mmol/L — ABNORMAL LOW (ref 3.5–5.2)
Sodium: 144 mmol/L (ref 134–144)
eGFR: 115 mL/min/{1.73_m2} (ref >59)

## 2022-10-27 ENCOUNTER — Other Ambulatory Visit: Payer: Self-pay | Admitting: *Deleted

## 2022-10-27 DIAGNOSIS — E876 Hypokalemia: Secondary | ICD-10-CM

## 2022-11-18 ENCOUNTER — Other Ambulatory Visit: Payer: BLUE CROSS/BLUE SHIELD

## 2022-11-25 ENCOUNTER — Ambulatory Visit: Payer: BLUE CROSS/BLUE SHIELD | Attending: Physician Assistant

## 2022-11-25 DIAGNOSIS — E876 Hypokalemia: Secondary | ICD-10-CM

## 2022-11-25 LAB — BASIC METABOLIC PANEL
BUN/Creatinine Ratio: 6 — ABNORMAL LOW (ref 9–23)
BUN: 5 mg/dL — ABNORMAL LOW (ref 6–20)
CO2: 24 mmol/L (ref 20–29)
Calcium: 9.5 mg/dL (ref 8.7–10.2)
Chloride: 104 mmol/L (ref 96–106)
Creatinine, Ser: 0.77 mg/dL (ref 0.57–1.00)
Glucose: 109 mg/dL — ABNORMAL HIGH (ref 70–99)
Potassium: 3.3 mmol/L — ABNORMAL LOW (ref 3.5–5.2)
Sodium: 142 mmol/L (ref 134–144)
eGFR: 102 mL/min/{1.73_m2} (ref >59)

## 2022-11-28 NOTE — Progress Notes (Unsigned)
Cardiology Office Note:    Date:  11/28/2022   ID:  Carly Ramos, DOB 15-Jan-1986, MRN [id number]  PCP:  Gustavus Bryant, PA-C   [facility] Providers Cardiologist:  Freada Bergeron, MD {   Referring MD: Gustavus Bryant, PA-C    History of Present Illness:    Carly Ramos is a 37 y.o. female with a hx of HTN, OSA, DMII and Afib who presents to clinic for follow-up.  Was initially diagnosed with Afib in ~2020 where she developed palpitations. Went to [facility] hospital which confirmed Afib s/p DCCV. Was initially on [facility] which was subsequently stopped due to low CHADs-vasc. Was started on atenolol for rate control. Notably, she was about 14month post-partum at that time and was pumping.   She was seen in 11/2021 where she was having increase in palpitations after recent COVID infection. Cardiac monitor showed NSR, rare ectopy, no Afib. TTE 12/2021 showed EF 60-65%, normal RV, no significant valve disease. She was started on metop '25mg'$  XL daily.  Today, ***    Past Medical History:  Diagnosis Date   [place]    COVID    Diabetes ([place]    Dyspnea on exertion    Fibroids    Hypertension    Hypokalemia    OSA (obstructive sleep apnea)    Racing heart beat    SOB (shortness of breath) on exertion     Past Surgical History:  Procedure Laterality Date   CHOLECYSTECTOMY N/A 06/10/2020   Procedure: LAPAROSCOPIC CHOLECYSTECTOMY WITH ATTEMPTED INTRAOPERATIVE CHOLANGIOGRAM;  Surgeon: WGreer Pickerel MD;  [facility]  Service: General;  Laterality: N/A;    Current Medications: No outpatient medications have been marked as taking for the 12/06/22 encounter (Appointment) with PFreada Bergeron MD.     Allergies:   Penicillins   Social History   Socioeconomic History   Marital status: Single    Spouse name: Not on file   Number of children: Not on file   Years of education: Not on file   Highest education level: Not on file  Occupational History   Not on file   Tobacco Use   Smoking status: Never   Smokeless tobacco: Never  Substance and Sexual Activity   Alcohol use: Not Currently   Drug use: Never   Sexual activity: Not on file  Other Topics Concern   Not on file  Social History Narrative   Not on file   Social Determinants of Health   Financial Resource Strain: Not on file  Food Insecurity: Not on file  Transportation Needs: Not on file  Physical Activity: Not on file  Stress: Not on file  Social Connections: Not on file     Family History: The patient's family history includes CAD in her father.  ROS:   Please see the history of present illness.    Review of Systems  Constitutional:  Positive for malaise/fatigue. Negative for chills and fever.  Respiratory:  Positive for shortness of breath.   Cardiovascular:  Positive for palpitations. Negative for chest pain, orthopnea, claudication, leg swelling and PND.  Gastrointestinal:  Negative for nausea and vomiting.  Musculoskeletal:  Negative for falls.  Neurological:  Negative for dizziness and loss of consciousness.     EKGs/Labs/Other Studies Reviewed:    The following studies were reviewed today: Echocardiogram 12/2021   IMPRESSIONS     1. Left ventricular ejection fraction, by estimation, is 60 to 65%. The  left ventricle has normal function. The left ventricle  has no regional  wall motion abnormalities. Left ventricular diastolic parameters were  normal.   2. Right ventricular systolic function is normal. The right ventricular  size is normal. Tricuspid regurgitation signal is inadequate for assessing  PA pressure.   3. The mitral valve is normal in structure. Trivial mitral valve  regurgitation. No evidence of mitral stenosis.   4. The aortic valve is normal in structure. Aortic valve regurgitation is  not visualized. No aortic stenosis is present.   5. The inferior vena cava is normal in size with greater than 50%  respiratory variability, suggesting right  atrial pressure of 3 mmHg.      Monitor 12/2021   Patch wear time was 13 days and 17 hours Predominant rhythm was NSR with average HR 79bpm (ranging from 42-165bpm) She had one 3 second sinus pause with sleep Patient triggered events correlated with sinus rhythm/sinus tachycardia Rare SVE and VE (<1%) No sustained arrhythmias     Patch Wear Time:  13 days and 17 hours (2023-01-16T19:40:43-0500 to 2023-01-30T12:50:55-0500)   Patient had a min HR of 42 bpm, max HR of 165 bpm, and avg HR of 79 bpm. Predominant underlying rhythm was Sinus Rhythm. 1 Pause occurred lasting 3 secs (20 bpm). Isolated SVEs were rare (<1.0%), SVE Couplets were rare (<1.0%), and no SVE Triplets were  present. Isolated VEs were rare (<1.0%), and no VE Couplets or VE Triplets were present.  EKG:  EKG is  ordered today.  The ekg ordered today demonstrates NSR with HR 66  Recent Labs: 12/14/2021: ALT 11 10/11/2022: Hemoglobin 12.9; Magnesium 2.2; Platelets 304; TSH 1.570 11/25/2022: BUN 5; Creatinine, Ser 0.77; Potassium 3.3; Sodium 142  Recent Lipid Panel    Component Value Date/Time   CHOL 109 06/09/2020 0609   TRIG 46 06/09/2020 0609   HDL 42 06/09/2020 0609   CHOLHDL 2.6 06/09/2020 0609   VLDL 9 06/09/2020 0609   LDLCALC 58 06/09/2020 0609     Risk Assessment/Calculations:    CHA2DS2-VASc Score =    This indicates a  % annual risk of stroke. The patient's score is based upon:             Physical Exam:    VS:  There were no vitals taken for this visit.    Wt Readings from Last 3 Encounters:  10/11/22 175 lb 12.8 oz (79.7 kg)  04/13/22 175 lb (79.4 kg)  12/14/21 167 lb 12.3 oz (76.1 kg)     GEN:  Well nourished, well developed in no acute distress HEENT: Normal NECK: No JVD; No carotid bruits CARDIAC: RRR, no murmurs, rubs, gallops RESPIRATORY:  Clear to auscultation without rales, wheezing or rhonchi  ABDOMEN: Soft, non-tender, non-distended MUSCULOSKELETAL:  No edema; No deformity   SKIN: Warm and dry NEUROLOGIC:  Alert and oriented x 3 PSYCHIATRIC:  Normal affect   ASSESSMENT:    No diagnosis found.  PLAN:    In order of problems listed above:  #Known Afib: CHADs-vasc 2 for gender and HTN. Patient developed Afib in 2020 where she was seen in [facility] ER s/p DCCV. ? Triggered by postpartum state. Was initially on [facility] which was later stopped due to low CHADs risk. Cardiac monitor 11/2021 with no Afib. Currently, *** -Continue metoprolol '25mg'$  XL daily -Not on AC due to low CHADs-vas  #HTN: Controlled. -Continue amlodipine '10mg'$  daily -Continue metop '25mg'$  XL daily        Medication Adjustments/Labs and Tests Ordered: Current medicines are reviewed at length with the  patient today.  Concerns regarding medicines are outlined above.  No orders of the defined types were placed in this encounter.  No orders of the defined types were placed in this encounter.   There are no Patient Instructions on file for this visit.    Signed, Freada Bergeron, MD  11/28/2022 3:57 PM    [place]

## 2022-11-29 ENCOUNTER — Other Ambulatory Visit: Payer: Self-pay | Admitting: *Deleted

## 2022-11-29 DIAGNOSIS — E876 Hypokalemia: Secondary | ICD-10-CM

## 2022-12-06 ENCOUNTER — Ambulatory Visit: Payer: BLUE CROSS/BLUE SHIELD | Attending: Cardiology | Admitting: Cardiology

## 2022-12-06 ENCOUNTER — Encounter: Payer: Self-pay | Admitting: Cardiology

## 2022-12-06 VITALS — BP 120/84 | HR 67 | Ht 63.0 in | Wt 171.6 lb

## 2022-12-06 DIAGNOSIS — E876 Hypokalemia: Secondary | ICD-10-CM | POA: Diagnosis not present

## 2022-12-06 DIAGNOSIS — I480 Paroxysmal atrial fibrillation: Secondary | ICD-10-CM

## 2022-12-06 DIAGNOSIS — I10 Essential (primary) hypertension: Secondary | ICD-10-CM | POA: Diagnosis not present

## 2022-12-06 DIAGNOSIS — Z79899 Other long term (current) drug therapy: Secondary | ICD-10-CM

## 2022-12-06 MED ORDER — AMLODIPINE BESYLATE 5 MG PO TABS: 5.0000 mg | ORAL_TABLET | Freq: Every day | ORAL | 2 refills | Status: DC

## 2022-12-06 MED ORDER — SPIRONOLACTONE 25 MG PO TABS: 25.0000 mg | ORAL_TABLET | Freq: Every day | ORAL | 2 refills | Status: DC

## 2022-12-06 NOTE — Patient Instructions (Signed)
Medication Instructions:   DECREASE YOUR AMLODIPINE TO 5 MG BY MOUTH DAILY  START TAKING SPIRONOLACTONE 25 MG BY MOUTH DAILY  *If you need a refill on your cardiac medications before your next appointment, please call your pharmacy*   Lab Work:  [place] OFFICE--BMET  If you have labs (blood work) drawn today and your tests are completely normal, you will receive your results only by: [place] (if you have MyChart) OR A paper copy in the mail If you have any lab test that is abnormal or we need to change your treatment, we will call you to review the results.    Follow-Up: At [facility], you and your health needs are our priority.  As part of our continuing mission to provide you with exceptional heart care, we have created designated Provider Care Teams.  These Care Teams include your primary Cardiologist (physician) and Advanced Practice Providers (APPs -  Physician Assistants and Nurse Practitioners) who all work together to provide you with the care you need, when you need it.  We recommend signing up for the patient portal called "MyChart".  Sign up information is provided on this After Visit Summary.  MyChart is used to connect with patients for Virtual Visits (Telemedicine).  Patients are able to view lab/test results, encounter notes, upcoming appointments, etc.  Non-urgent messages can be sent to your provider as well.   To learn more about what you can do with MyChart, go to [email or web address].    Your next appointment:   6 month(s)  Provider:   Freada Bergeron, MD

## 2022-12-16 ENCOUNTER — Other Ambulatory Visit: Payer: BLUE CROSS/BLUE SHIELD

## 2022-12-21 ENCOUNTER — Other Ambulatory Visit: Payer: BLUE CROSS/BLUE SHIELD

## 2022-12-23 ENCOUNTER — Ambulatory Visit: Payer: BLUE CROSS/BLUE SHIELD | Attending: Cardiology

## 2022-12-23 DIAGNOSIS — E876 Hypokalemia: Secondary | ICD-10-CM

## 2022-12-23 DIAGNOSIS — Z79899 Other long term (current) drug therapy: Secondary | ICD-10-CM

## 2022-12-23 DIAGNOSIS — I480 Paroxysmal atrial fibrillation: Secondary | ICD-10-CM

## 2022-12-23 DIAGNOSIS — I10 Essential (primary) hypertension: Secondary | ICD-10-CM

## 2022-12-24 LAB — BASIC METABOLIC PANEL
BUN/Creatinine Ratio: 7 — ABNORMAL LOW (ref 9–23)
BUN: 5 mg/dL — ABNORMAL LOW (ref 6–20)
CO2: 22 mmol/L (ref 20–29)
Calcium: 9 mg/dL (ref 8.7–10.2)
Chloride: 102 mmol/L (ref 96–106)
Creatinine, Ser: 0.72 mg/dL (ref 0.57–1.00)
Glucose: 116 mg/dL — ABNORMAL HIGH (ref 70–99)
Potassium: 3.9 mmol/L (ref 3.5–5.2)
Sodium: 136 mmol/L (ref 134–144)
eGFR: 111 mL/min/{1.73_m2} (ref >59)

## 2023-01-10 ENCOUNTER — Encounter: Payer: Self-pay | Admitting: Cardiology

## 2023-01-10 DIAGNOSIS — I10 Essential (primary) hypertension: Secondary | ICD-10-CM

## 2023-01-10 DIAGNOSIS — I480 Paroxysmal atrial fibrillation: Secondary | ICD-10-CM

## 2023-01-10 MED ORDER — METOPROLOL SUCCINATE ER 25 MG PO TB24: 25.0000 mg | ORAL_TABLET | Freq: Every day | ORAL | 3 refills | Status: DC

## 2023-01-12 ENCOUNTER — Telehealth: Payer: Self-pay | Admitting: Cardiology

## 2023-01-12 NOTE — Telephone Encounter (Signed)
Pt c/o medication issue:  1. Name of Medication: potassium chloride SA (KLOR-CON M20) 20 MEQ tablet   2. How are you currently taking this medication (dosage and times per day)? Take 1 tablet (20 mEq total) by mouth daily.   3. Are you having a reaction (difficulty breathing--STAT)? no  4. What is your medication issue? Patient is calling because the new potassium pills she picked up from the pharmacy do not match the old ones she has.  She states she recently switched pharmacies. She wants to make sure they gave her the correct medication.

## 2023-01-12 NOTE — Telephone Encounter (Signed)
Spoke with patient and she was concerned because her potassium pill she picked up from a new pharmacy did not look like her previous potassium pill. I did inform patient that depending on manufacturer medication may differ in size and color. She stated she did a Producer, television/film/video from the numbers on the pill and penicillin came up. Being that she is concerned I advised her to return the medication to her pharmacy and consult with her pharmacist.

## 2023-05-29 NOTE — Progress Notes (Signed)
Cardiology Office Note:    Date:  06/02/2023   ID:  Carly Ramos, DOB 1986-04-26, MRN [id number]  PCP:  Bonnetta Barry, PA-C   [facility] Providers Cardiologist:  Meriam Sprague, MD {   Referring MD: Bonnetta Barry, PA-C    History of Present Illness:    Carly Ramos is a 36 y.o. female with a hx of HTN, OSA, DMII and Afib who presents to clinic for follow-up.  Was initially diagnosed with Afib in ~2020 where she developed palpitations. Went to [facility] hospital which confirmed Afib s/p DCCV. Was initially on [facility] which was subsequently stopped due to low CHADs-vasc. Was started on atenolol for rate control. Notably, she was about 3months post-partum at that time and was pumping.   She was seen in 11/2021 where she was having increase in palpitations after recent COVID infection. Cardiac monitor showed NSR, rare ectopy, no Afib. TTE 12/2021 showed EF 60-65%, normal RV, no significant valve disease. She was started on metop 25mg  XL daily.  Was last seen in clinic on 11/2022 where she was doing well from a CV standpoint. Was struggling with diarrhea following cholecystectomy and low K in that setting. We started spiro at that time.  Today, the patient overall feels well. Was seen in urgent care several days ago due to back pain which was thought to be due to constipation. She was started on miralax with significant improvement. No chest pain, SOB, orthopnea or PND. She has resumed exercising and has felt well. No significant palpitations.   Has not monitored BP at home but is elevated today. Admits that she had a high salt meal and missed her BP medication this AM.  Past Medical History:  Diagnosis Date   A-fib (HCC)    COVID    Diabetes (HCC)    Dyspnea on exertion    Fibroids    Hypertension    Hypokalemia    OSA (obstructive sleep apnea)    Racing heart beat    SOB (shortness of breath) on exertion     Past Surgical History:  Procedure Laterality Date    CHOLECYSTECTOMY N/A 06/10/2020   Procedure: LAPAROSCOPIC CHOLECYSTECTOMY WITH ATTEMPTED INTRAOPERATIVE CHOLANGIOGRAM;  Surgeon: Gaynelle Adu, MD;  Location: [facility] OR;  Service: General;  Laterality: N/A;    Current Medications: Current Meds  Medication Sig   amLODipine (NORVASC) 5 MG tablet Take 1 tablet (5 mg total) by mouth daily.   aspirin 81 MG EC tablet Take 1 tablet by mouth daily.   magnesium gluconate (MAGONATE) 500 MG tablet Take 500 mg by mouth daily.   metoprolol succinate (TOPROL XL) 25 MG 24 hr tablet Take 1 tablet (25 mg total) by mouth daily.   Multiple Vitamins-Minerals (MULTIVITAMIN ADULT PO) Take 1 tablet by mouth daily. gummies   spironolactone (ALDACTONE) 25 MG tablet Take 1 tablet (25 mg total) by mouth daily.   [DISCONTINUED] potassium chloride SA (KLOR-CON M20) 20 MEQ tablet Take 1 tablet (20 mEq total) by mouth daily.     Allergies:   Penicillins   Social History   Socioeconomic History   Marital status: Single    Spouse name: Not on file   Number of children: Not on file   Years of education: Not on file   Highest education level: Not on file  Occupational History   Not on file  Tobacco Use   Smoking status: Never   Smokeless tobacco: Never  Substance and Sexual Activity   Alcohol use: Not Currently  Drug use: Never   Sexual activity: Not on file  Other Topics Concern   Not on file  Social History Narrative   Not on file   Social Determinants of Health   Financial Resource Strain: Not on file  Food Insecurity: Not on file  Transportation Needs: Not on file  Physical Activity: Not on file  Stress: Not on file  Social Connections: Not on file     Family History: The patient's family history includes CAD in her father.  ROS:   Please see the history of present illness.       EKGs/Labs/Other Studies Reviewed:    The following studies were reviewed today: Echocardiogram 12/2021   IMPRESSIONS     1. Left ventricular ejection fraction, by  estimation, is 60 to 65%. The  left ventricle has normal function. The left ventricle has no regional  wall motion abnormalities. Left ventricular diastolic parameters were  normal.   2. Right ventricular systolic function is normal. The right ventricular  size is normal. Tricuspid regurgitation signal is inadequate for assessing  PA pressure.   3. The mitral valve is normal in structure. Trivial mitral valve  regurgitation. No evidence of mitral stenosis.   4. The aortic valve is normal in structure. Aortic valve regurgitation is  not visualized. No aortic stenosis is present.   5. The inferior vena cava is normal in size with greater than 50%  respiratory variability, suggesting right atrial pressure of 3 mmHg.      Monitor 12/2021   Patch wear time was 13 days and 17 hours Predominant rhythm was NSR with average HR 79bpm (ranging from 42-165bpm) She had one 3 second sinus pause with sleep Patient triggered events correlated with sinus rhythm/sinus tachycardia Rare SVE and VE (<1%) No sustained arrhythmias     Patch Wear Time:  13 days and 17 hours (2023-01-16T19:40:43-0500 to 2023-01-30T12:50:55-0500)   Patient had a min HR of 42 bpm, max HR of 165 bpm, and avg HR of 79 bpm. Predominant underlying rhythm was Sinus Rhythm. 1 Pause occurred lasting 3 secs (20 bpm). Isolated SVEs were rare (<1.0%), SVE Couplets were rare (<1.0%), and no SVE Triplets were  present. Isolated VEs were rare (<1.0%), and no VE Couplets or VE Triplets were present.  EKG:  EKG with NSR-personally reviewed  Recent Labs: 10/11/2022: Hemoglobin 12.9; Magnesium 2.2; Platelets 304; TSH 1.570 12/23/2022: BUN 5; Creatinine, Ser 0.72; Potassium 3.9; Sodium 136  Recent Lipid Panel    Component Value Date/Time   CHOL 109 06/09/2020 0609   TRIG 46 06/09/2020 0609   HDL 42 06/09/2020 0609   CHOLHDL 2.6 06/09/2020 0609   VLDL 9 06/09/2020 0609   LDLCALC 58 06/09/2020 0609     Risk Assessment/Calculations:     CHA2DS2-VASc Score = 2  This indicates a 2.2% annual risk of stroke. The patient's score is based upon: CHF History: 0 HTN History: 1 Diabetes History: 0 Stroke History: 0 Vascular Disease History: 0 Age Score: 0 Gender Score: 1            Physical Exam:    VS:  BP (!) 144/100   Pulse 76   Ht 5' 3.5" (1.613 m)   Wt 179 lb 12.8 oz (81.6 kg)   SpO2 96%   BMI 31.35 kg/m     Wt Readings from Last 3 Encounters:  06/02/23 179 lb 12.8 oz (81.6 kg)  12/06/22 171 lb 9.6 oz (77.8 kg)  10/11/22 175 lb 12.8 oz (79.7 kg)  GEN:  Comfortable, NAD HEENT: Normal NECK: No JVD; No carotid bruits CARDIAC: RRR, 1/6 systolic murmur RUSB RESPIRATORY:  Clear to auscultation without rales, wheezing or rhonchi  ABDOMEN: Soft, non-tender, non-distended MUSCULOSKELETAL:  No edema; No deformity  SKIN: Warm and dry NEUROLOGIC:  Alert and oriented x 3 PSYCHIATRIC:  Normal affect   ASSESSMENT:    1. AF (paroxysmal atrial fibrillation) (HCC)   2. Essential hypertension   3. Hypokalemia   4. Medication management     PLAN:    In order of problems listed above:  #Known Afib: CHADs-vasc 2 for gender and HTN. Patient developed Afib in 2020 where she was seen in [facility] ER s/p DCCV. ? Triggered by postpartum state. Was initially on [facility] which was later stopped due to low CHADs risk and low Afib burden. Cardiac monitor 11/2021 with no Afib. Currently, doing well without significant palpitations.  -Continue metoprolol 25mg  XL daily -Not on AC due to low burden/no known recurrence  #HTN: Elevated today but she missed her medication. -Continue amlodipine 5mg  daily  -Continue metop 25mg  XL daily -Continue spironolactone 25mg  daily -BMET today -Will check BP at home and send in log for [place] to review  #HypoK: #Diarrhea: Overall improved and now with constipation.  -Continue spiro as above -Stop potassium supplementation now that diarrhea resolved -Check BMET  #CV Prevention: -Discussed  lifestyle at length as detailed below; patient very motivated to lose weight  Exercise recommendations: Goal of exercising for at least 30 minutes a day, at least 5 times per week.  Please exercise to a moderate exertion.  This means that while exercising it is difficult to speak in full sentences, however you are not so short of breath that you feel you must stop, and not so comfortable that you can carry on a full conversation.  Exertion level should be approximately a 5/10, if 10 is the most exertion you can perform.  Diet recommendations: Recommend a heart healthy diet such as the Mediterranean diet.  This diet consists of plant based foods, healthy fats, lean meats, olive oil.  It suggests limiting the intake of simple carbohydrates such as white breads, pastries, and pastas.  It also limits the amount of red meat, wine, and dairy products such as cheese that one should consume on a daily basis.         Medication Adjustments/Labs and Tests Ordered: Current medicines are reviewed at length with the patient today.  Concerns regarding medicines are outlined above.  Orders Placed This Encounter  Procedures   Basic metabolic panel   Lipid Profile   EKG 12-Lead   No orders of the defined types were placed in this encounter.   Patient Instructions  Medication Instructions:   STOP TAKING POTASSIUM CHLORIDE NOW  *If you need a refill on your cardiac medications before your next appointment, please call your pharmacy*   Lab Work:  TODAY--LIPIDS AND BMET  If you have labs (blood work) drawn today and your tests are completely normal, you will receive your results only by: MyChart Message (if you have MyChart) OR A paper copy in the mail If you have any lab test that is abnormal or we need to change your treatment, we will call you to review the results.    Follow-Up: At [facility], you and your health needs are our priority.  As part of our continuing mission to  provide you with exceptional heart care, we have created designated Provider Care Teams.  These Care Teams include  your primary Cardiologist (physician) and Advanced Practice Providers (APPs -  Physician Assistants and Nurse Practitioners) who all work together to provide you with the care you need, when you need it.  We recommend signing up for the patient portal called "MyChart".  Sign up information is provided on this After Visit Summary.  MyChart is used to connect with patients for Virtual Visits (Telemedicine).  Patients are able to view lab/test results, encounter notes, upcoming appointments, etc.  Non-urgent messages can be sent to your provider as well.   To learn more about what you can do with MyChart, go to [email or web address].    Your next appointment:   6 month(s)  Provider:   Jodelle Red, MD      Other Instructions Mediterranean Diet A Mediterranean diet is based on the traditions of countries on the [place]. It focuses on eating more: Fruits and vegetables. Whole grains, beans, nuts, and seeds. Heart-healthy fats. These are fats that are good for your heart. It involves eating less: Dairy. Meat and eggs. Processed foods with added sugar, salt, and fat. This type of diet can help prevent certain conditions. It can also improve outcomes if you have a long-term (chronic) disease, such as kidney or heart disease. What are tips for following this plan? Reading food labels Check packaged foods for: The serving size. For foods such as rice and pasta, the serving size is the amount of cooked product, not dry. The total fat. Avoid foods with saturated fat or trans fat. Added sugars, such as corn syrup. Shopping  Try to have a balanced diet. Buy a variety of foods, such as: Fresh fruits and vegetables. You may be able to get these from local farmers markets. You can also buy them frozen. Grains, beans, nuts, and seeds. Some of these can be bought in  bulk. Fresh seafood. Poultry and eggs. Low-fat dairy products. Buy whole ingredients instead of foods that have already been packaged. If you can't get fresh seafood, buy precooked frozen shrimp or canned fish, such as tuna, salmon, or sardines. Stock your pantry so you always have certain foods on hand, such as olive oil, canned tuna, canned tomatoes, rice, pasta, and beans. Cooking Cook foods with extra-virgin olive oil instead of using butter or other vegetable oils. Have meat as a side dish. Have vegetables or grains as your main dish. This means having meat in small portions or adding small amounts of meat to foods like pasta or stew. Use beans or vegetables instead of meat in common dishes like chili or lasagna. Try out different cooking methods. Try roasting, broiling, steaming, and sauting vegetables. Add frozen vegetables to soups, stews, pasta, or rice. Add nuts or seeds for added healthy fats and plant protein at each meal. You can add these to yogurt, salads, or vegetable dishes. Marinate fish or vegetables using olive oil, lemon juice, garlic, and fresh herbs. Meal planning Plan to eat a vegetarian meal one day each week. Try to work up to two vegetarian meals, if possible. Eat seafood two or more times a week. Have healthy snacks on hand. These may include: Vegetable sticks with hummus. Greek yogurt. Fruit and nut trail mix. Eat balanced meals. These should include: Fruit: 2-3 servings a day. Vegetables: 4-5 servings a day. Low-fat dairy: 2 servings a day. Fish, poultry, or lean meat: 1 serving a day. Beans and legumes: 2 or more servings a week. Nuts and seeds: 1-2 servings a day. Whole grains: 6-8 servings a day. Extra-virgin olive  oil: 3-4 servings a day. Limit red meat and sweets to just a few servings a month. Lifestyle  Try to cook and eat meals with your family. Drink enough fluid to keep your pee (urine) pale yellow. Be active every day. This  includes: Aerobic exercise, which is exercise that causes your heart to beat faster. Examples include running and swimming. Leisure activities like gardening, walking, or housework. Get 7-8 hours of sleep each night. Drink red wine if your provider says you can. A glass of wine is 5 oz (150 mL). You may be allowed to have: Up to 1 glass a day if you're female and not pregnant. Up to 2 glasses a day if you're female. What foods should I eat? Fruits Apples. Apricots. Avocado. Berries. Bananas. Cherries. Dates. Figs. Grapes. Lemons. Melon. Oranges. Peaches. Plums. Pomegranate. Vegetables Artichokes. Beets. Broccoli. Cabbage. Carrots. Eggplant. Green beans. Chard. Kale. Spinach. Onions. Leeks. Peas. Squash. Tomatoes. Peppers. Radishes. Grains Whole-grain pasta. Brown rice. Bulgur wheat. Polenta. Couscous. Whole-wheat bread. Orpah Cobb. Meats and other proteins Beans. Almonds. Sunflower seeds. Pine nuts. Peanuts. Cod. Salmon. Scallops. Shrimp. Tuna. Tilapia. Clams. Oysters. Eggs. Chicken or [place] without skin. Dairy Low-fat milk. Cheese. Greek yogurt. Fats and oils Extra-virgin olive oil. Avocado oil. Grapeseed oil. Beverages Water. Red wine. Herbal tea. Sweets and desserts Greek yogurt with honey. Baked apples. Poached pears. Trail mix. Seasonings and condiments Basil. Cilantro. Coriander. Cumin. Mint. Parsley. Sage. Rosemary. Tarragon. Garlic. Oregano. Thyme. Pepper. Balsamic vinegar. Tahini. Hummus. Tomato sauce. Olives. Mushrooms. The items listed above may not be all the foods and drinks you can have. Talk to a dietitian to learn more. What foods should I limit? This is a list of foods that should be eaten rarely. Fruits Fruit canned in syrup. Vegetables Deep-fried potatoes, like [place] fries. Grains Packaged pasta or rice dishes. Cereal with added sugar. Snacks with added sugar. Meats and other proteins Beef. Pork. Lamb. Chicken or [place] with skin. Hot dogs.  Tomasa Blase. Dairy Ice cream. Sour cream. Whole milk. Fats and oils Butter. Canola oil. Vegetable oil. Beef fat (tallow). Lard. Beverages Juice. Sugar-sweetened soft drinks. Beer. Liquor and spirits. Sweets and desserts Cookies. Cakes. Pies. Candy. Seasonings and condiments Mayonnaise. Pre-made sauces and marinades. The items listed above may not be all the foods and drinks you should limit. Talk to a dietitian to learn more. Where to find more information American Heart Association (AHA): heart.org This information is not intended to replace advice given to you by your health care provider. Make sure you discuss any questions you have with your health care provider. Document Revised: 02/05/2023 Document Reviewed: 02/05/2023 Elsevier Patient Education  2024 Elsevier Inc.       Signed, Meriam Sprague, MD  06/02/2023 10:04 AM    [facility]

## 2023-06-02 ENCOUNTER — Encounter: Payer: Self-pay | Admitting: Cardiology

## 2023-06-02 ENCOUNTER — Ambulatory Visit: Payer: BLUE CROSS/BLUE SHIELD | Admitting: Cardiology

## 2023-06-02 VITALS — BP 144/100 | HR 76 | Ht 63.5 in | Wt 179.8 lb

## 2023-06-02 DIAGNOSIS — Z79899 Other long term (current) drug therapy: Secondary | ICD-10-CM

## 2023-06-02 DIAGNOSIS — E876 Hypokalemia: Secondary | ICD-10-CM | POA: Diagnosis not present

## 2023-06-02 DIAGNOSIS — I10 Essential (primary) hypertension: Secondary | ICD-10-CM | POA: Diagnosis not present

## 2023-06-02 DIAGNOSIS — I480 Paroxysmal atrial fibrillation: Secondary | ICD-10-CM

## 2023-06-02 NOTE — Patient Instructions (Signed)
Medication Instructions:   STOP TAKING POTASSIUM CHLORIDE NOW  *If you need a refill on your cardiac medications before your next appointment, please call your pharmacy*   Lab Work:  TODAY--LIPIDS AND BMET  If you have labs (blood work) drawn today and your tests are completely normal, you will receive your results only by: MyChart Message (if you have MyChart) OR A paper copy in the mail If you have any lab test that is abnormal or we need to change your treatment, we will call you to review the results.    Follow-Up: At [facility], you and your health needs are our priority.  As part of our continuing mission to provide you with exceptional heart care, we have created designated Provider Care Teams.  These Care Teams include your primary Cardiologist (physician) and Advanced Practice Providers (APPs -  Physician Assistants and Nurse Practitioners) who all work together to provide you with the care you need, when you need it.  We recommend signing up for the patient portal called "MyChart".  Sign up information is provided on this After Visit Summary.  MyChart is used to connect with patients for Virtual Visits (Telemedicine).  Patients are able to view lab/test results, encounter notes, upcoming appointments, etc.  Non-urgent messages can be sent to your provider as well.   To learn more about what you can do with MyChart, go to [email or web address].    Your next appointment:   6 month(s)  Provider:   Jodelle Red, MD      Other Instructions Mediterranean Diet A Mediterranean diet is based on the traditions of countries on the [place]. It focuses on eating more: Fruits and vegetables. Whole grains, beans, nuts, and seeds. Heart-healthy fats. These are fats that are good for your heart. It involves eating less: Dairy. Meat and eggs. Processed foods with added sugar, salt, and fat. This type of diet can help prevent certain conditions. It can  also improve outcomes if you have a long-term (chronic) disease, such as kidney or heart disease. What are tips for following this plan? Reading food labels Check packaged foods for: The serving size. For foods such as rice and pasta, the serving size is the amount of cooked product, not dry. The total fat. Avoid foods with saturated fat or trans fat. Added sugars, such as corn syrup. Shopping  Try to have a balanced diet. Buy a variety of foods, such as: Fresh fruits and vegetables. You may be able to get these from local farmers markets. You can also buy them frozen. Grains, beans, nuts, and seeds. Some of these can be bought in bulk. Fresh seafood. Poultry and eggs. Low-fat dairy products. Buy whole ingredients instead of foods that have already been packaged. If you can't get fresh seafood, buy precooked frozen shrimp or canned fish, such as tuna, salmon, or sardines. Stock your pantry so you always have certain foods on hand, such as olive oil, canned tuna, canned tomatoes, rice, pasta, and beans. Cooking Cook foods with extra-virgin olive oil instead of using butter or other vegetable oils. Have meat as a side dish. Have vegetables or grains as your main dish. This means having meat in small portions or adding small amounts of meat to foods like pasta or stew. Use beans or vegetables instead of meat in common dishes like chili or lasagna. Try out different cooking methods. Try roasting, broiling, steaming, and sauting vegetables. Add frozen vegetables to soups, stews, pasta, or rice. Add nuts or seeds for  added healthy fats and plant protein at each meal. You can add these to yogurt, salads, or vegetable dishes. Marinate fish or vegetables using olive oil, lemon juice, garlic, and fresh herbs. Meal planning Plan to eat a vegetarian meal one day each week. Try to work up to two vegetarian meals, if possible. Eat seafood two or more times a week. Have healthy snacks on hand. These  may include: Vegetable sticks with hummus. Greek yogurt. Fruit and nut trail mix. Eat balanced meals. These should include: Fruit: 2-3 servings a day. Vegetables: 4-5 servings a day. Low-fat dairy: 2 servings a day. Fish, poultry, or lean meat: 1 serving a day. Beans and legumes: 2 or more servings a week. Nuts and seeds: 1-2 servings a day. Whole grains: 6-8 servings a day. Extra-virgin olive oil: 3-4 servings a day. Limit red meat and sweets to just a few servings a month. Lifestyle  Try to cook and eat meals with your family. Drink enough fluid to keep your pee (urine) pale yellow. Be active every day. This includes: Aerobic exercise, which is exercise that causes your heart to beat faster. Examples include running and swimming. Leisure activities like gardening, walking, or housework. Get 7-8 hours of sleep each night. Drink red wine if your provider says you can. A glass of wine is 5 oz (150 mL). You may be allowed to have: Up to 1 glass a day if you're female and not pregnant. Up to 2 glasses a day if you're female. What foods should I eat? Fruits Apples. Apricots. Avocado. Berries. Bananas. Cherries. Dates. Figs. Grapes. Lemons. Melon. Oranges. Peaches. Plums. Pomegranate. Vegetables Artichokes. Beets. Broccoli. Cabbage. Carrots. Eggplant. Green beans. Chard. Kale. Spinach. Onions. Leeks. Peas. Squash. Tomatoes. Peppers. Radishes. Grains Whole-grain pasta. Brown rice. Bulgur wheat. Polenta. Couscous. Whole-wheat bread. Orpah Cobb. Meats and other proteins Beans. Almonds. Sunflower seeds. Pine nuts. Peanuts. Cod. Salmon. Scallops. Shrimp. Tuna. Tilapia. Clams. Oysters. Eggs. Chicken or [place] without skin. Dairy Low-fat milk. Cheese. Greek yogurt. Fats and oils Extra-virgin olive oil. Avocado oil. Grapeseed oil. Beverages Water. Red wine. Herbal tea. Sweets and desserts Greek yogurt with honey. Baked apples. Poached pears. Trail mix. Seasonings and  condiments Basil. Cilantro. Coriander. Cumin. Mint. Parsley. Sage. Rosemary. Tarragon. Garlic. Oregano. Thyme. Pepper. Balsamic vinegar. Tahini. Hummus. Tomato sauce. Olives. Mushrooms. The items listed above may not be all the foods and drinks you can have. Talk to a dietitian to learn more. What foods should I limit? This is a list of foods that should be eaten rarely. Fruits Fruit canned in syrup. Vegetables Deep-fried potatoes, like [place] fries. Grains Packaged pasta or rice dishes. Cereal with added sugar. Snacks with added sugar. Meats and other proteins Beef. Pork. Lamb. Chicken or [place] with skin. Hot dogs. Tomasa Blase. Dairy Ice cream. Sour cream. Whole milk. Fats and oils Butter. Canola oil. Vegetable oil. Beef fat (tallow). Lard. Beverages Juice. Sugar-sweetened soft drinks. Beer. Liquor and spirits. Sweets and desserts Cookies. Cakes. Pies. Candy. Seasonings and condiments Mayonnaise. Pre-made sauces and marinades. The items listed above may not be all the foods and drinks you should limit. Talk to a dietitian to learn more. Where to find more information American Heart Association (AHA): heart.org This information is not intended to replace advice given to you by your health care provider. Make sure you discuss any questions you have with your health care provider. Document Revised: 02/05/2023 Document Reviewed: 02/05/2023 Elsevier Patient Education  2024 [employer].

## 2023-06-06 ENCOUNTER — Telehealth: Payer: Self-pay | Admitting: *Deleted

## 2023-06-06 DIAGNOSIS — Z79899 Other long term (current) drug therapy: Secondary | ICD-10-CM

## 2023-06-06 DIAGNOSIS — E876 Hypokalemia: Secondary | ICD-10-CM

## 2023-06-06 DIAGNOSIS — I10 Essential (primary) hypertension: Secondary | ICD-10-CM

## 2023-06-06 DIAGNOSIS — I480 Paroxysmal atrial fibrillation: Secondary | ICD-10-CM

## 2023-06-06 MED ORDER — SPIRONOLACTONE 25 MG PO TABS: 25.0000 mg | ORAL_TABLET | Freq: Every day | ORAL | 3 refills | Status: AC

## 2023-06-06 NOTE — Telephone Encounter (Signed)
The patient has been notified of the result and verbalized understanding.  All questions (if any) were answered.  Pt aware that per Dr. Shari Prows, she should continue back taking her spironolactone 25 mg po daily given her diarrhea has stopped.  Pt aware that she can now stop the PO Potassium supplement, since we are resuming back her spironolactone.   Pt aware I will send in more refills of her spironolactone 25 mg to her confirmed pharmacy of choice.  Pt verbalized understanding and agrees with this plan.

## 2023-06-06 NOTE — Telephone Encounter (Signed)
-----   Message from Meriam Sprague sent at 06/05/2023  9:01 PM EDT ----- Her potassium is still on the lower end but given that her diarrhea resolved, we can continue with spironolactone and hold with PO potassium. Cholesterol looks great and the rest of her electrolytes and kidney function look good!

## 2023-10-18 IMAGING — CT CT ABD-PELV W/ CM
2 of 4 series · 16 of 46 positions shown, 18 images · IV contrast (APPLIED)
Comparison: MRI 06/09/2020, CT 06/08/2020

CLINICAL DATA: Abdomen pain with emesis

EXAM:
CT ABDOMEN AND PELVIS WITH CONTRAST
TECHNIQUE: Multidetector CT imaging of the abdomen and pelvis was performed
using the standard protocol following bolus administration of
intravenous contrast.

[Series 2: abd pel w · axial · 0.82mm/px · z∈[-78,+347]mm · 13 of 93 slices shown, 15 images]
[im 4/93  soft-tissue]
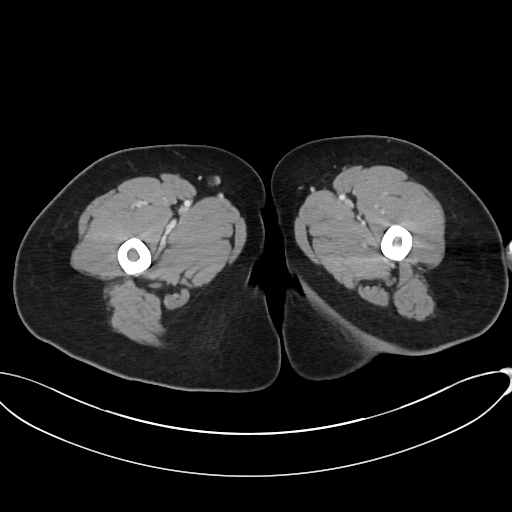
[im 4/93  bone]
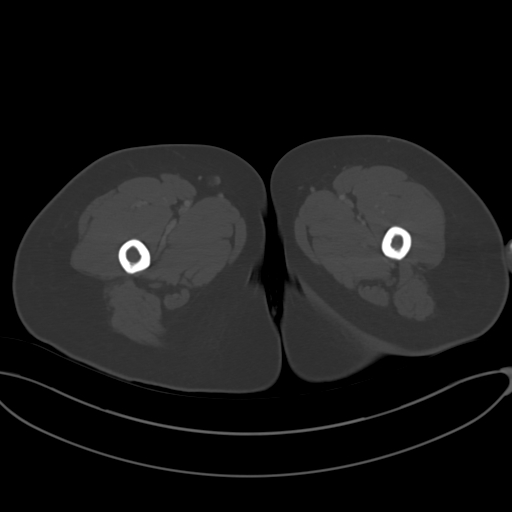
[im 11/93  soft-tissue]
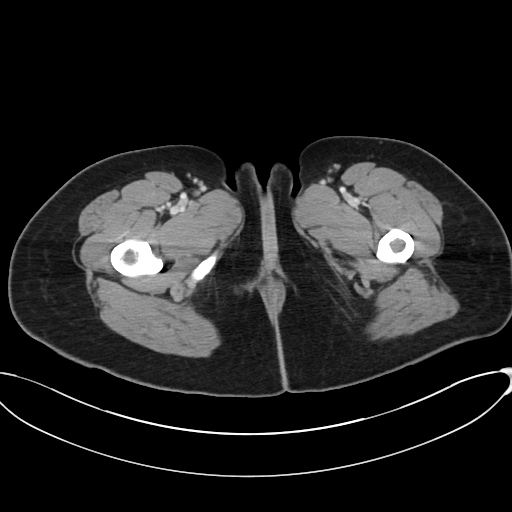
[im 18/93  soft-tissue]
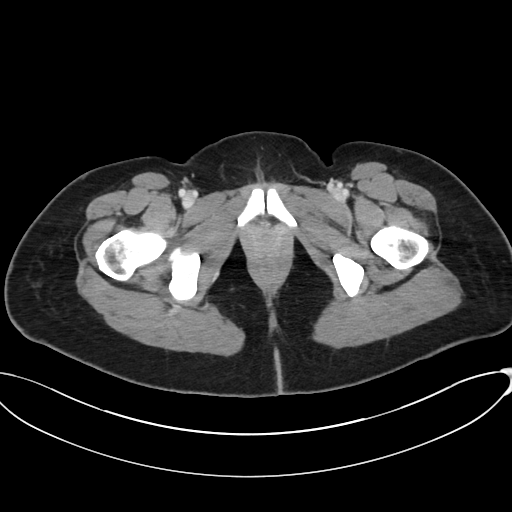
[im 25/93  soft-tissue]
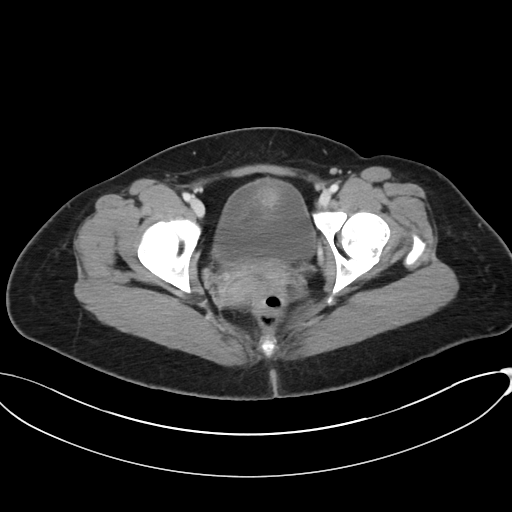
[im 32/93  soft-tissue]
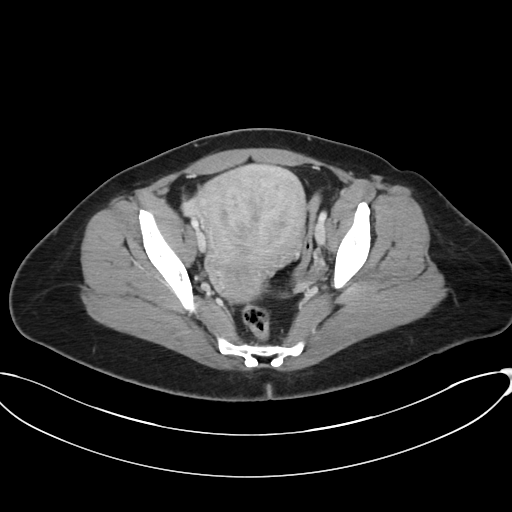
[im 39/93  soft-tissue]
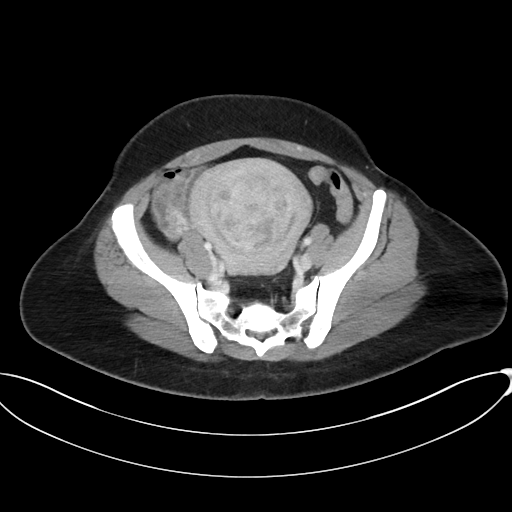
[im 47/93  soft-tissue]
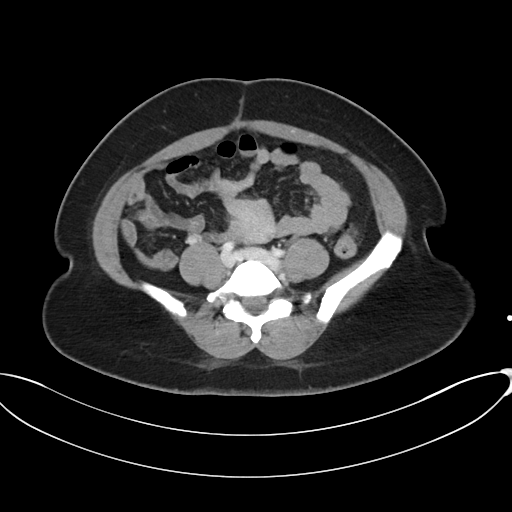
[im 54/93  soft-tissue]
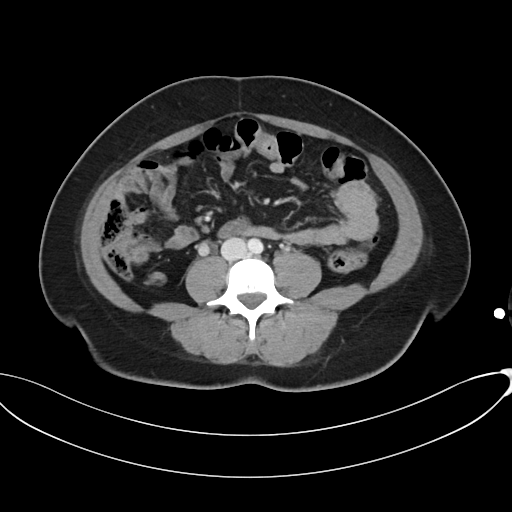
[im 61/93  soft-tissue]
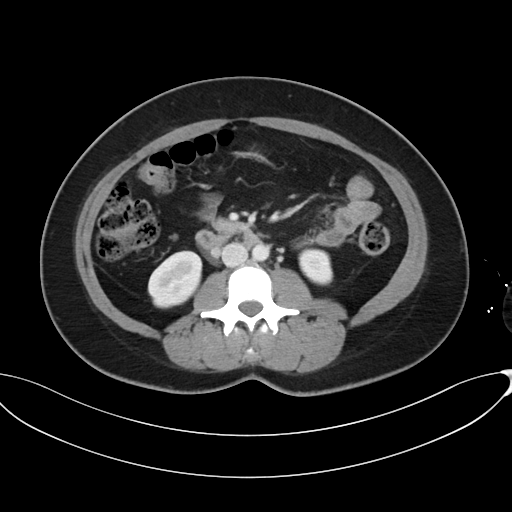
[im 61/93  bone]
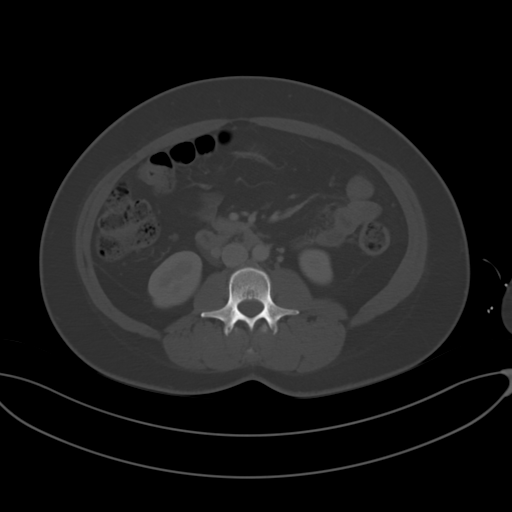
[im 68/93  soft-tissue]
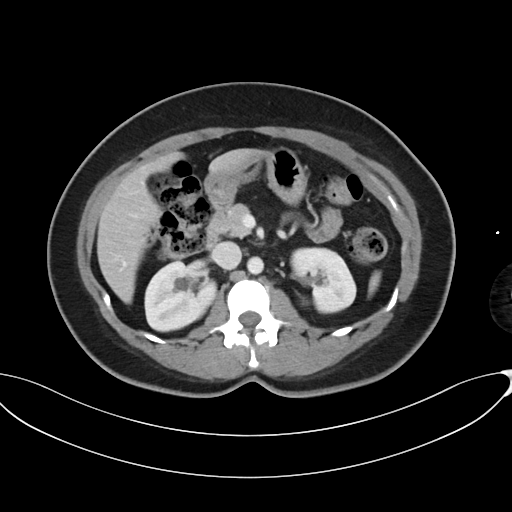
[im 75/93  soft-tissue]
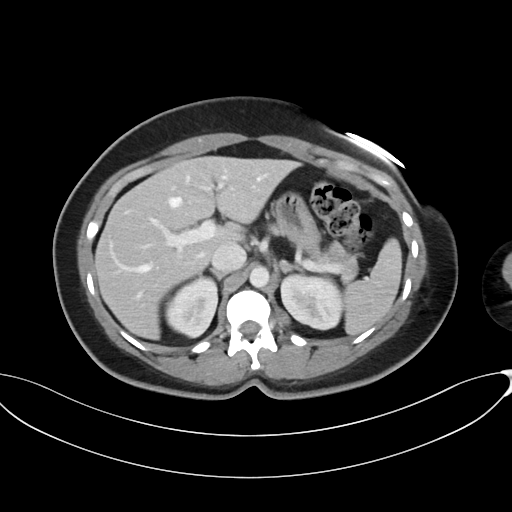
[im 82/93  soft-tissue]
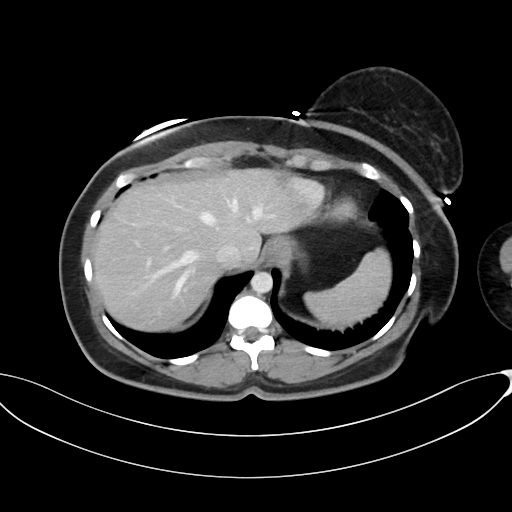
[im 89/93  soft-tissue]
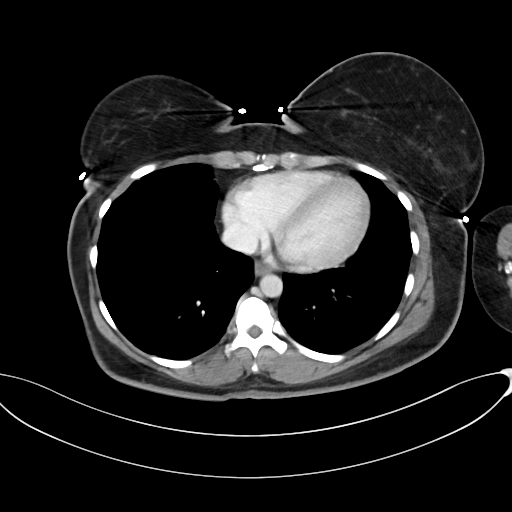

[Series 5: coronal · coronal · 0.83mm/px · 3 of 94 slices shown]
[im 32/94  soft-tissue]
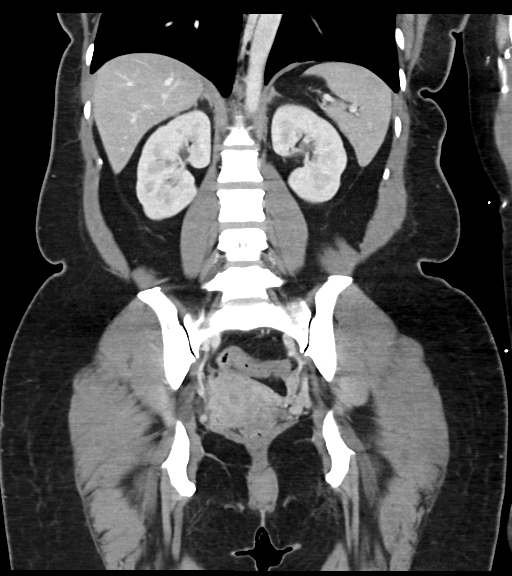
[im 42/94  soft-tissue]
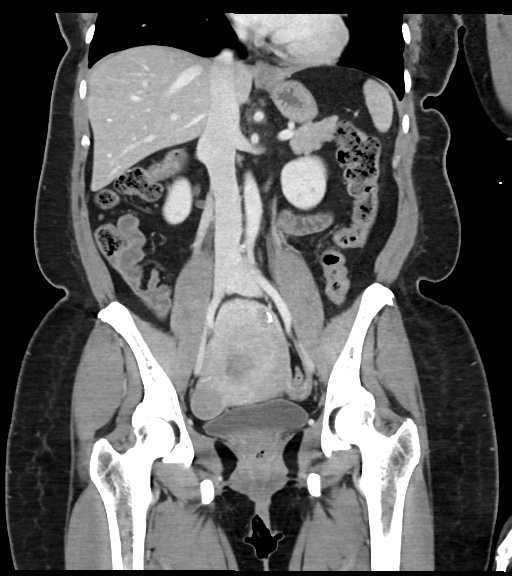
[im 52/94  soft-tissue]
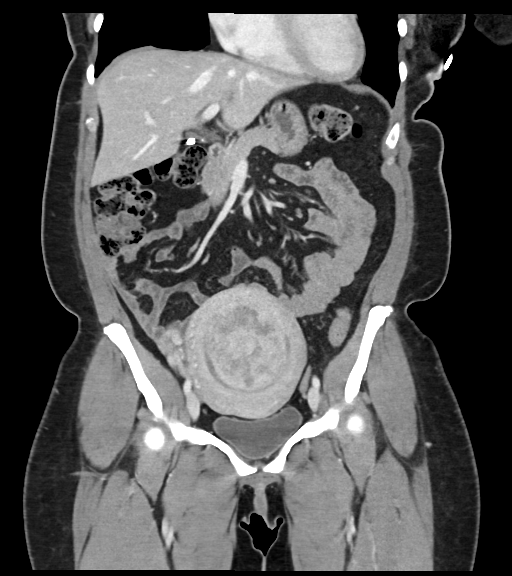

[16 of 46 positions shown; findings below may reference images not displayed]

RADIATION DOSE REDUCTION: This exam was performed according to the
departmental dose-optimization program which includes automated
exposure control, adjustment of the mA and/or kV according to
patient size and/or use of iterative reconstruction technique.

CONTRAST:  100mL OMNIPAQUE IOHEXOL 300 MG/ML  SOLN
FINDINGS: Lower chest: Lung bases demonstrate no acute consolidation or
pleural effusion. Normal cardiac size.

Hepatobiliary: Status post cholecystectomy. No biliary dilatation or
focal hepatic abnormality.

Pancreas: Unremarkable. No pancreatic ductal dilatation or
surrounding inflammatory changes.

Spleen: Normal in size without focal abnormality.

Adrenals/Urinary Tract: Adrenal glands are normal. There is mild
right hydronephrosis and hydroureter but no obstructing stone is
seen. The bladder is unremarkable

Stomach/Bowel: The stomach is nonenlarged. No dilated small bowel.
Negative appendix. No bowel wall thickening

Vascular/Lymphatic: No significant vascular findings are present. No
enlarged abdominal or pelvic lymph nodes.

Reproductive: Enlarged uterus with multiple masses. 3 cm right
pelvic mass, series 2, image 65 appears to be exophytic to the lower
uterine segment as suspect that right ovary is seen on image 58 of
series 2 more anterior. There is a large intracavitary mass within
the uterine body and fundus measuring 7 by 7 by 8 cm.

Other: Negative for pelvic effusion or free air

Musculoskeletal: No acute osseous abnormality
IMPRESSION: 1. No CT evidence for acute intra-abdominal or pelvic abnormality
2. Mild right hydronephrosis and hydroureter but no obstructing
stone is seen.
3. Enlarged uterus with multiple fibroids. Large 8 cm intracavitary
appearing mass within the uterine fundus and body is indeterminate
for large fibroid or endometrial mass. Gynecology consultation is
recommended.

## 2023-10-20 ENCOUNTER — Other Ambulatory Visit: Payer: Self-pay

## 2023-10-20 ENCOUNTER — Telehealth: Payer: Self-pay | Admitting: Physician Assistant

## 2023-10-20 ENCOUNTER — Other Ambulatory Visit (HOSPITAL_COMMUNITY): Payer: Self-pay

## 2023-10-20 DIAGNOSIS — I10 Essential (primary) hypertension: Secondary | ICD-10-CM

## 2023-10-20 DIAGNOSIS — I480 Paroxysmal atrial fibrillation: Secondary | ICD-10-CM

## 2023-10-20 MED ORDER — METOPROLOL SUCCINATE ER 25 MG PO TB24
25.0000 mg | ORAL_TABLET | Freq: Every day | ORAL | 0 refills | Status: DC
Start: 2023-10-20 — End: 2024-01-09
  Filled 2023-10-20: qty 30, 30d supply, fill #0
  Filled 2023-12-14: qty 30, 30d supply, fill #1

## 2023-10-20 NOTE — Telephone Encounter (Signed)
 *  STAT* If patient is at the pharmacy, call can be transferred to refill team.   1. Which medications need to be refilled? (please list name of each medication and dose if known) metoprolol succinate (TOPROL XL) 25 MG 24 hr tablet    2. Would you like to learn more about the convenience, safety, & potential cost savings by using the [facility]?    3. Are you open to using the [facility] (Type [facility].    4. Which pharmacy/location (including street and city if local pharmacy) is medication to be sent to?  [id number] [place], [place] - [address]     5. Do they need a 30 day or 90 day supply? 90 days   Pt is out of meds, need refills

## 2023-11-09 ENCOUNTER — Other Ambulatory Visit: Payer: Self-pay

## 2023-11-09 DIAGNOSIS — I10 Essential (primary) hypertension: Secondary | ICD-10-CM

## 2023-11-09 DIAGNOSIS — Z79899 Other long term (current) drug therapy: Secondary | ICD-10-CM

## 2023-11-09 DIAGNOSIS — E876 Hypokalemia: Secondary | ICD-10-CM

## 2023-11-09 DIAGNOSIS — I480 Paroxysmal atrial fibrillation: Secondary | ICD-10-CM

## 2023-11-09 MED ORDER — AMLODIPINE BESYLATE 5 MG PO TABS
5.0000 mg | ORAL_TABLET | Freq: Every day | ORAL | 1 refills | Status: DC
Start: 2023-11-09 — End: 2024-05-24

## 2024-01-09 ENCOUNTER — Encounter (HOSPITAL_BASED_OUTPATIENT_CLINIC_OR_DEPARTMENT_OTHER): Payer: Self-pay | Admitting: Cardiology

## 2024-01-09 ENCOUNTER — Ambulatory Visit (HOSPITAL_BASED_OUTPATIENT_CLINIC_OR_DEPARTMENT_OTHER): Payer: BLUE CROSS/BLUE SHIELD | Admitting: Cardiology

## 2024-01-09 VITALS — BP 130/70 | HR 100 | Ht 63.5 in | Wt 182.4 lb

## 2024-01-09 DIAGNOSIS — Z7189 Other specified counseling: Secondary | ICD-10-CM | POA: Diagnosis not present

## 2024-01-09 DIAGNOSIS — Z713 Dietary counseling and surveillance: Secondary | ICD-10-CM

## 2024-01-09 DIAGNOSIS — Z7182 Exercise counseling: Secondary | ICD-10-CM

## 2024-01-09 DIAGNOSIS — G4733 Obstructive sleep apnea (adult) (pediatric): Secondary | ICD-10-CM | POA: Diagnosis not present

## 2024-01-09 DIAGNOSIS — I10 Essential (primary) hypertension: Secondary | ICD-10-CM

## 2024-01-09 DIAGNOSIS — I480 Paroxysmal atrial fibrillation: Secondary | ICD-10-CM

## 2024-01-09 MED ORDER — METOPROLOL SUCCINATE ER 25 MG PO TB24: 25.0000 mg | ORAL_TABLET | Freq: Every day | ORAL | 3 refills | Status: AC

## 2024-01-09 NOTE — Patient Instructions (Signed)
 Medication Instructions:  Your physician recommends that you continue on your current medications as directed. Please refer to the Current Medication list given to you today.   *If you need a refill on your cardiac medications before your next appointment, please call your pharmacy*  Follow-Up: At [facility], you and your health needs are our priority.  As part of our continuing mission to provide you with exceptional heart care, we have created designated Provider Care Teams.  These Care Teams include your primary Cardiologist (physician) and Advanced Practice Providers (APPs -  Physician Assistants and Nurse Practitioners) who all work together to provide you with the care you need, when you need it.  We recommend signing up for the patient portal called "MyChart".  Sign up information is provided on this After Visit Summary.  MyChart is used to connect with patients for Virtual Visits (Telemedicine).  Patients are able to view lab/test results, encounter notes, upcoming appointments, etc.  Non-urgent messages can be sent to your provider as well.   To learn more about what you can do with MyChart, go to [email or web address].    Your next appointment:   6 month(s)  Provider:   Jodelle Red, MD, Eligha Bridegroom, NP, or Gillian Shields, NP

## 2024-01-09 NOTE — Progress Notes (Signed)
 Cardiology Office Note:  .   Date:  01/09/2024  ID:  Nadene Rubins, DOB 04-30-1986, MRN [id number] PCP: Andria Meuse  [facility] Providers Cardiologist:  Jodelle Red, MD {  History of Present Illness: .   Carly Ramos is a 38 y.o. female with PMH hypertension, OSA, type II diabetes, and atrial fibrillation. She was previously followed by Dr. Shari Prows and established care with me on 01/09/24.  Pertinent CV history: Initially diagnosed with atrial fibrillation in 2020 (several months postpartum). Echo 2023 EF 60-65%, normal RV, no significant valve disease.   Today: Feeling defeated that her BP is hard to get to goal. Also has questions re afib. Discussed at length.  Has not had any symptoms of afib since her initial event. We discussed risk factors for afib.  Has OSA but hasn't had machine or management in about 5 years. Needs to establish care here.  Has history of preeclampsia. We discussed this in terms of risk factor for future cardiovascular disease.  Has a home cuff, home readings 130/70. Higher when she eats out a lot, can be 120s/70s if she's been cooking at home. Had swelling on 10 mg amlodipine dose, doing well on 5 mg dose. Also on spironolactone, does well with this. Was on atenolol and chlorthalidone in the past, had hypokalemia needed supplements  Discussed diet and exercise at length today.  ROS: Denies chest pain, shortness of breath at rest or with normal exertion. No PND, orthopnea, LE edema or unexpected weight gain. No syncope or palpitations. ROS otherwise negative except as noted.   Studies Reviewed: Marland Kitchen    EKG:       Physical Exam:   VS:  BP 130/70 Comment: home  Pulse 100   Ht 5' 3.5" (1.613 m)   Wt 182 lb 6.4 oz (82.7 kg)   SpO2 96%   BMI 31.80 kg/m    Wt Readings from Last 3 Encounters:  01/09/24 182 lb 6.4 oz (82.7 kg)  06/02/23 179 lb 12.8 oz (81.6 kg)  12/06/22 171 lb 9.6 oz (77.8 kg)    GEN: Well nourished, well  developed in no acute distress HEENT: Normal, moist mucous membranes NECK: No JVD CARDIAC: regular rhythm, normal S1 and S2, no rubs or gallops. No murmur. VASCULAR: Radial and DP pulses 2+ bilaterally. No carotid bruits RESPIRATORY:  Clear to auscultation without rales, wheezing or rhonchi  ABDOMEN: Soft, non-tender, non-distended MUSCULOSKELETAL:  Ambulates independently SKIN: Warm and dry, no edema NEUROLOGIC:  Alert and oriented x 3. No focal neuro deficits noted. PSYCHIATRIC:  Normal affect    ASSESSMENT AND PLAN: .    Isolated/paroxysmal atrial fibrillation -no known recurrence since 2020 -we discussed risk factors, recommendations for management of risk  OSA -was previously on CPAP in [place] but has not had in ~5 years since she moved -will message our sleep study coordinator to see best test/way to get her re-established with a CPAP  Hypertension -well controlled at home, though can be increased with salt/eating out -we discussed diet recommendations, especially sodium avoidance -was on chlorthalidone before but had some hypokalemia. With her salt sensitivity, this would be a good option, and hypokalemia would be mitigated by spironolactone -continue amlodipine 5 mg, had edema on 10 mg dose -continue spironolactone 25 mg daily -for now, continue metoprolol succinate 25 mg daily. Discussed that this is more for HR than BP, and with no further afib, could consider stopping in the future. Alternatively, if more BP control needed, could change to  carvedilol  CV risk counseling and prevention -recommend heart healthy/Mediterranean diet, with whole grains, fruits, vegetable, fish, lean meats, nuts, and olive oil. Limit salt. -recommend moderate walking, 3-5 times/week for 30-50 minutes each session. Aim for at least 150 minutes.week. Goal should be pace of 3 miles/hours, or walking 1.5 miles in 30 minutes -recommend avoidance of tobacco products. Avoid excess alcohol. -ASCVD risk  score: The ASCVD Risk score (Arnett DK, et al., 2019) failed to calculate for the following reasons:   The 2019 ASCVD risk score is only valid for ages 34 to 59    Dispo: 1 year or sooner as needed  Total time of encounter: I spent 45 minutes dedicated to the care of this patient on the date of this encounter to include pre-visit review of records, face-to-face time with the patient discussing conditions above, and clinical documentation with the electronic health record. We specifically spent time today discussing recommendations for diet and exercise at length, specifically Mediterranean and low salt diet, risk factors for afib, and management of hypertension and sleep apnea.   Signed, Jodelle Red, MD   Jodelle Red, MD, PhD, [facility]  [facility]    Heart & Vascular at [facility] at [facility] [address] [place], [place] [address] [phone]

## 2024-05-23 ENCOUNTER — Other Ambulatory Visit: Payer: Self-pay | Admitting: Cardiology

## 2024-05-23 DIAGNOSIS — Z79899 Other long term (current) drug therapy: Secondary | ICD-10-CM

## 2024-05-23 DIAGNOSIS — I10 Essential (primary) hypertension: Secondary | ICD-10-CM

## 2024-05-23 DIAGNOSIS — E876 Hypokalemia: Secondary | ICD-10-CM

## 2024-05-23 DIAGNOSIS — I480 Paroxysmal atrial fibrillation: Secondary | ICD-10-CM

## 2024-06-29 LAB — AMB RESULTS CONSOLE CBG: Glucose: 159

## 2024-09-19 ENCOUNTER — Ambulatory Visit
Admission: RE | Admit: 2024-09-19 | Discharge: 2024-09-19 | Disposition: A | Discharge status: Home / Self Care | Source: Ambulatory Visit | Attending: Nurse Practitioner | Admitting: Nurse Practitioner

## 2024-09-19 VITALS — BP 148/94 | HR 90 | Temp 97.9°F | Resp 16

## 2024-09-19 DIAGNOSIS — H6592 Unspecified nonsuppurative otitis media, left ear: Secondary | ICD-10-CM | POA: Diagnosis not present

## 2024-09-19 DIAGNOSIS — R0989 Other specified symptoms and signs involving the circulatory and respiratory systems: Secondary | ICD-10-CM | POA: Diagnosis not present

## 2024-09-19 DIAGNOSIS — J069 Acute upper respiratory infection, unspecified: Secondary | ICD-10-CM | POA: Diagnosis not present

## 2024-09-19 LAB — POC SOFIA SARS ANTIGEN FIA: SARS Coronavirus 2 Ag: NEGATIVE

## 2024-09-19 MED ORDER — FLUTICASONE PROPIONATE 50 MCG/ACT NA SUSP: 2.0000 | Freq: Every day | NASAL | 0 refills | Status: AC

## 2024-09-19 MED ORDER — PROMETHAZINE-DM 6.25-15 MG/5ML PO SYRP: 5.0000 mL | ORAL_SOLUTION | Freq: Four times a day (QID) | ORAL | 0 refills | Status: AC | PRN

## 2024-09-19 MED ORDER — AZITHROMYCIN 250 MG PO TABS: 250.0000 mg | ORAL_TABLET | Freq: Every day | ORAL | 0 refills | Status: AC

## 2024-09-19 NOTE — ED Provider Notes (Signed)
 [facility]    CSN: [id number] Arrival date & time: 09/19/24  1331      History   Chief Complaint Chief Complaint  Patient presents with   Nasal Congestion    HPI Carly Ramos is a 38 y.o. female.   The history is provided by the patient.   Patient presents with a 1 day history of headache, sore throat, nasal congestion, and runny nose.  Patient denies fever, chills, chest pain, abdominal pain, nausea, vomiting, diarrhea, or rash.  Patient states that she did experience some dizziness some yesterday, but that has since resolved.  States that she initially thought her symptoms were related to her allergies.  She denies any obvious close sick contacts.  So far, states she has been taking over-the-counter allergy medication and ibuprofen  for her symptoms.  Past Medical History:  Diagnosis Date   A-fib (HCC)    COVID    Diabetes (HCC)    Dyspnea on exertion    Fibroids    Hypertension    Hypokalemia    OSA (obstructive sleep apnea)    Racing heart beat    SOB (shortness of breath) on exertion     Patient Active Problem List   Diagnosis Date Noted   Cholelithiasis 06/08/2020   Essential hypertension 06/08/2020   Elevated LFTs 06/08/2020   Dehydration 06/08/2020   Hypokalemia 06/08/2020   AF (paroxysmal atrial fibrillation) (HCC) 06/08/2020    Past Surgical History:  Procedure Laterality Date   CHOLECYSTECTOMY N/A 06/10/2020   Procedure: LAPAROSCOPIC CHOLECYSTECTOMY WITH ATTEMPTED INTRAOPERATIVE CHOLANGIOGRAM;  Surgeon: Tanda Locus, MD;  Location: [facility] OR;  Service: General;  Laterality: N/A;    OB History   No obstetric history on file.      Home Medications    Prior to Admission medications   Medication Sig Start Date End Date Taking? Authorizing Provider  azithromycin (ZITHROMAX) 250 MG tablet Take 1 tablet (250 mg total) by mouth daily. Take first 2 tablets together, then 1 every day until finished. 09/19/24  Yes Leath-Warren, Etta PARAS, NP   fluticasone (FLONASE) 50 MCG/ACT nasal spray Place 2 sprays into both nostrils daily. 09/19/24  Yes Leath-Warren, Etta PARAS, NP  promethazine-dextromethorphan (PROMETHAZINE-DM) 6.25-15 MG/5ML syrup Take 5 mLs by mouth 4 (four) times daily as needed for cough. 09/19/24  Yes Leath-Warren, Etta PARAS, NP  amLODipine  (NORVASC ) 5 MG tablet TAKE 1 TABLET (5 MG TOTAL) BY MOUTH DAILY. 05/24/24   Lonni Slain, MD  aspirin 81 MG EC tablet Take 1 tablet by mouth daily. 03/06/20   [provider]  magnesium  gluconate (MAGONATE) 500 MG tablet Take 500 mg by mouth daily.    [provider]  metoprolol  succinate (TOPROL  XL) 25 MG 24 hr tablet Take 1 tablet (25 mg total) by mouth daily. 01/09/24   Lonni Slain, MD  Multiple Vitamins-Minerals (MULTIVITAMIN ADULT PO) Take 1 tablet by mouth daily. gummies    [provider]  spironolactone  (ALDACTONE ) 25 MG tablet Take 1 tablet (25 mg total) by mouth daily. 06/06/23   Hobart Powell BRAVO, MD    Family History Family History  Problem Relation Age of Onset   CAD Father     Social History Social History   Tobacco Use   Smoking status: Never   Smokeless tobacco: Never  Substance Use Topics   Alcohol use: Not Currently   Drug use: Never     Allergies   Penicillins   Review of Systems Review of Systems Per HPI  Physical Exam Triage  Vital Signs ED Triage Vitals  Encounter Vitals Group     BP 09/19/24 1415 (!) 148/94     Girls Systolic BP Percentile --      Girls Diastolic BP Percentile --      Boys Systolic BP Percentile --      Boys Diastolic BP Percentile --      Pulse Rate 09/19/24 1415 90     Resp 09/19/24 1415 16     Temp 09/19/24 1415 97.9 F (36.6 C)     Temp Source 09/19/24 1415 Oral     SpO2 09/19/24 1415 96 %     Weight --      Height --      Head Circumference --      Peak Flow --      Pain Score 09/19/24 1413 7     Pain Loc --      Pain Education --      Exclude from Growth  Chart --    No data found.  Updated Vital Signs BP (!) 148/94 (BP Location: Right Arm)   Pulse 90   Temp 97.9 F (36.6 C) (Oral)   Resp 16   LMP 04/01/2022   SpO2 96%   Visual Acuity Right Eye Distance:   Left Eye Distance:   Bilateral Distance:    Right Eye Near:   Left Eye Near:    Bilateral Near:     Physical Exam Vitals and nursing note reviewed.  Constitutional:      General: She is not in acute distress.    Appearance: Normal appearance. She is well-developed.  HENT:     Head: Normocephalic and atraumatic.     Right Ear: Ear canal and external ear normal. Tympanic membrane is erythematous. Tympanic membrane is not bulging.     Left Ear: Ear canal and external ear normal. Tympanic membrane is erythematous and bulging.     Nose: Congestion present.     Right Turbinates: Enlarged and swollen.     Left Turbinates: Enlarged and swollen.     Right Sinus: No maxillary sinus tenderness or frontal sinus tenderness.     Left Sinus: No maxillary sinus tenderness or frontal sinus tenderness.     Mouth/Throat:     Lips: Pink.     Mouth: Mucous membranes are moist.     Pharynx: Uvula midline. Posterior oropharyngeal erythema and postnasal drip present. No pharyngeal swelling, oropharyngeal exudate or uvula swelling.     Comments: Cobblestoning present to posterior oropharynx  Eyes:     Extraocular Movements: Extraocular movements intact.     Conjunctiva/sclera: Conjunctivae normal.     Pupils: Pupils are equal, round, and reactive to light.  Neck:     Thyroid : No thyromegaly.     Trachea: No tracheal deviation.  Cardiovascular:     Rate and Rhythm: Normal rate and regular rhythm.     Pulses: Normal pulses.     Heart sounds: Normal heart sounds.  Pulmonary:     Effort: Pulmonary effort is normal. No respiratory distress.     Breath sounds: Normal breath sounds. No stridor. No wheezing, rhonchi or rales.  Abdominal:     General: Bowel sounds are normal.     Palpations:  Abdomen is soft.     Tenderness: There is no abdominal tenderness.  Musculoskeletal:     Cervical back: Normal range of motion and neck supple.  Skin:    General: Skin is warm and dry.  Neurological:  General: No focal deficit present.     Mental Status: She is alert and oriented to person, place, and time.  Psychiatric:        Mood and Affect: Mood normal.        Behavior: Behavior normal.        Thought Content: Thought content normal.        Judgment: Judgment normal.      UC Treatments / Results  Labs (all labs ordered are listed, but only abnormal results are displayed) Labs Reviewed  POC SOFIA SARS ANTIGEN FIA    EKG   Radiology No results found.  Procedures Procedures (including critical care time)  Medications Ordered in UC Medications - No data to display  Initial Impression / Assessment and Plan / UC Course  I have reviewed the triage vital signs and the nursing notes.  Pertinent labs & imaging results that were available during my care of the patient were reviewed by me and considered in my medical decision making (see chart for details).  The COVID test was negative.  On initial exam, the patient's lung sounds were clear throughout, room air sats are at 96%.  She does have bulging and erythema of the left tympanic membrane, consistent with left otitis media.  Right tympanic membrane is erythematous.  Will treat with azithromycin 250 mg.  Patient with underlying viral URI with cough.  Will provide symptomatic treatment with fluticasone 50 mcg nasal spray and Promethazine DM for the cough.  Supportive care recommendations were provided and discussed with the patient to include over-the-counter analgesics, use of normal saline nasal spray, warm salt water  gargles, warm compresses to the affected ears, and use of a humidifier during sleep.  Discussed indications with the patient regarding follow-up.  Patient was in agreement with this plan of care and verbalizes  understanding.  All questions were answered.  Patient stable for discharge.  Work note was provided.  Final Clinical Impressions(s) / UC Diagnoses   Final diagnoses:  Symptoms of upper respiratory infection (URI)  Left otitis media with effusion  Viral URI with cough     Discharge Instructions      The COVID test was negative. Take medication as prescribed. Increase fluids and allow for plenty of rest. You may continue over-the-counter Tylenol  or ibuprofen  as needed for pain, fever, or general discomfort. Recommend use of normal saline nasal spray throughout the day for nasal congestion or runny nose. For your cough, you may find it helpful to use a humidifier in your bedroom at nighttime during sleep and to sleep elevated on pillows while symptoms persist. You may apply warm compresses to the ears as needed for pain or discomfort. If your symptoms fail to improve with this treatment, or begin to worsen, you may follow-up in this clinic or with your primary care physician for further evaluation. Follow-up as needed.     ED Prescriptions     Medication Sig Dispense Auth. Provider   fluticasone (FLONASE) 50 MCG/ACT nasal spray Place 2 sprays into both nostrils daily. 16 g Leath-Warren, Etta PARAS, NP   azithromycin (ZITHROMAX) 250 MG tablet Take 1 tablet (250 mg total) by mouth daily. Take first 2 tablets together, then 1 every day until finished. 6 tablet Leath-Warren, Etta PARAS, NP   promethazine-dextromethorphan (PROMETHAZINE-DM) 6.25-15 MG/5ML syrup Take 5 mLs by mouth 4 (four) times daily as needed for cough. 118 mL Leath-Warren, Etta PARAS, NP      PDMP not reviewed this encounter.   Gilmer Etta  J, NP 09/19/24 1448

## 2024-09-19 NOTE — Discharge Instructions (Signed)
 The COVID test was negative. Take medication as prescribed. Increase fluids and allow for plenty of rest. You may continue over-the-counter Tylenol  or ibuprofen  as needed for pain, fever, or general discomfort. Recommend use of normal saline nasal spray throughout the day for nasal congestion or runny nose. For your cough, you may find it helpful to use a humidifier in your bedroom at nighttime during sleep and to sleep elevated on pillows while symptoms persist. You may apply warm compresses to the ears as needed for pain or discomfort. If your symptoms fail to improve with this treatment, or begin to worsen, you may follow-up in this clinic or with your primary care physician for further evaluation. Follow-up as needed.

## 2024-09-19 NOTE — ED Triage Notes (Signed)
 Pt states sore throat,congestion,headache since yesterday.  States she was dizzy yesterday but that has subsided.
